# Patient Record
Sex: Female | Born: 1990
Health system: Southern US, Community
[De-identification: ages and names within clinical notes are randomized; demographics above are authoritative.]

## PROBLEM LIST (undated history)

## (undated) DIAGNOSIS — B029 Zoster without complications: Secondary | ICD-10-CM

## (undated) DIAGNOSIS — F32A Depression, unspecified: Secondary | ICD-10-CM

## (undated) DIAGNOSIS — F4310 Post-traumatic stress disorder, unspecified: Secondary | ICD-10-CM

## (undated) DIAGNOSIS — F329 Major depressive disorder, single episode, unspecified: Secondary | ICD-10-CM

## (undated) DIAGNOSIS — M792 Neuralgia and neuritis, unspecified: Secondary | ICD-10-CM

## (undated) HISTORY — DX: Post-traumatic stress disorder, unspecified: F43.10

## (undated) HISTORY — DX: Zoster without complications: B02.9

## (undated) HISTORY — DX: Depression, unspecified: F32.A

## (undated) HISTORY — DX: Neuralgia and neuritis, unspecified: M79.2

## (undated) HISTORY — PX: INCISE AND DRAIN ABCESS: PRO64

## (undated) HISTORY — DX: Major depressive disorder, single episode, unspecified: F32.9

## (undated) NOTE — Progress Notes (Signed)
 Formatting of this note is different from the original. Images from the original note were not included. [facility] URGENT CARE  Subjective   Subjective:   Patient ID: [name] is an 29 y.o. female.  Chief Complaint: Chief Complaint  Patient presents with   Wound Infection    Right hand states had bug bite and its got infection    Animal Bite Contact animal:  Insect Location:  Hand Hand injury location:  Dorsum of R hand Time since incident:  2 days Pain details:    Quality:  Dull   Severity:  Mild   Progression:  Unchanged Relieved by:  Nothing Worsened by:  Nothing Ineffective treatments:  None tried Associated symptoms: no fever    No past medical history on file. No past surgical history on file. No family history on file. Social History   Tobacco Use   Smoking status: Never   Smokeless tobacco: Never   No Known Allergies Current Outpatient Medications on File Prior to Visit  Medication Sig Dispense Refill   LATUDA 40 mg Tab TK 1 T PO QPM WC  2   predniSONE  (DELTASONE ) 20 mg tablet Take 2 Tabs by mouth Daily. 10 Tab 0   triamcinolone (KENALOG) 0.1 % cream Apply to rash 2 times per day 454 g 0   Review of Systems  Constitutional:  Negative for activity change, chills, fatigue and fever.  HENT:  Negative for congestion, ear pain, postnasal drip and rhinorrhea.   Respiratory:  Negative for cough and shortness of breath.   Cardiovascular:  Negative for chest pain.  Gastrointestinal:  Negative for abdominal pain, diarrhea, nausea and vomiting.  Genitourinary:  Negative for dysuria.  Musculoskeletal:  Negative for myalgias.  Skin:  Negative for color change and pallor.  Neurological:  Negative for dizziness, light-headedness and headaches.     Objective  Objective:   BP 136/79   Pulse 84   Temp 97.4 F (36.3 C)   Wt 89.4 kg (197 lb)   LMP 05/30/2024   SpO2 99%   Physical Exam Vitals and nursing note reviewed.  Constitutional:      General: She  is not in acute distress.    Appearance: Normal appearance. She is not ill-appearing.  HENT:     Head: Normocephalic and atraumatic.     Right Ear: External ear normal.     Left Ear: External ear normal.     Nose: Nose normal.   Cardiovascular:     Rate and Rhythm: Normal rate.     Pulses: Normal pulses.  Pulmonary:     Effort: Pulmonary effort is normal. No respiratory distress.   Musculoskeletal:        General: Normal range of motion.       Arms:  Skin:    General: Skin is warm and dry.   Neurological:     General: No focal deficit present.     Mental Status: She is alert and oriented to person, place, and time.     GCS: GCS eye subscore is 4. GCS verbal subscore is 5. GCS motor subscore is 6.   Psychiatric:        Mood and Affect: Mood normal.        Behavior: Behavior normal.   Procedures   Assessment:   1. Insect bite of right hand, initial encounter  bacitracin 500 unit/gram Oint      Plan:   Orders Placed This Encounter   bacitracin 500 unit/gram Oint   Patient Instructions  General Discharge Instructions  You were evaluated in the Urgent Care today. Your condition is stable and safe for discharge. At this time, no serious illness or emergency was found. Some problems may change or become clearer with time.  Please monitor your symptoms closely. Return to Urgent Care or go to the ER if you notice: worsening pain, high fever, chest pain, shortness of breath, confusion, or any other concerning change.  Medications  If prescriptions were provided, take exactly as directed.  Do not stop early unless instructed by a provider.  Do not share medication with others.  Contact us  or your pharmacist with questions or if you notice side effects.  Self-care  Use over-the-counter medications such as ibuprofen  or acetaminophen  as needed for pain or fever (unless told otherwise). Rest, stay hydrated, and eat as tolerated.  Follow-up  Follow up with your  Primary Care Provider within the next few days for ongoing care and reevaluation as needed. If new or concerning symptoms develop, seek medical attention right away.    Electronically signed by Auston Chew, APRN at 06/07/2024  8:58 PM EDT

---
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
## 2010-02-06 ENCOUNTER — Encounter (INDEPENDENT_AMBULATORY_CARE_PROVIDER_SITE_OTHER): Payer: Self-pay | Admitting: Internal Medicine

## 2010-02-25 ENCOUNTER — Ambulatory Visit: Payer: Self-pay | Admitting: Internal Medicine

## 2010-02-25 DIAGNOSIS — J45909 Unspecified asthma, uncomplicated: Secondary | ICD-10-CM | POA: Insufficient documentation

## 2010-02-25 DIAGNOSIS — B3731 Acute candidiasis of vulva and vagina: Secondary | ICD-10-CM | POA: Insufficient documentation

## 2010-02-25 DIAGNOSIS — F988 Other specified behavioral and emotional disorders with onset usually occurring in childhood and adolescence: Secondary | ICD-10-CM | POA: Insufficient documentation

## 2010-02-25 DIAGNOSIS — F4310 Post-traumatic stress disorder, unspecified: Secondary | ICD-10-CM

## 2010-02-25 DIAGNOSIS — F319 Bipolar disorder, unspecified: Secondary | ICD-10-CM | POA: Insufficient documentation

## 2010-02-25 DIAGNOSIS — B373 Candidiasis of vulva and vagina: Secondary | ICD-10-CM

## 2010-02-25 DIAGNOSIS — J309 Allergic rhinitis, unspecified: Secondary | ICD-10-CM | POA: Insufficient documentation

## 2010-02-25 DIAGNOSIS — B081 Molluscum contagiosum: Secondary | ICD-10-CM

## 2010-02-25 LAB — CONVERTED CEMR LAB
Chlamydia, DNA Probe: NEGATIVE
Whiff Test: NEGATIVE

## 2010-02-28 ENCOUNTER — Encounter (INDEPENDENT_AMBULATORY_CARE_PROVIDER_SITE_OTHER): Payer: Self-pay | Admitting: Internal Medicine

## 2010-03-11 ENCOUNTER — Ambulatory Visit: Payer: Self-pay | Admitting: Internal Medicine

## 2010-04-02 ENCOUNTER — Ambulatory Visit: Payer: Self-pay | Admitting: Internal Medicine

## 2010-05-15 ENCOUNTER — Emergency Department (HOSPITAL_COMMUNITY): Admission: EM | Admit: 2010-05-15 | Discharge: 2010-05-15 | Payer: Self-pay | Admitting: Family Medicine

## 2010-05-28 ENCOUNTER — Ambulatory Visit: Payer: Self-pay | Admitting: Internal Medicine

## 2010-06-06 ENCOUNTER — Telehealth (INDEPENDENT_AMBULATORY_CARE_PROVIDER_SITE_OTHER): Payer: Self-pay | Admitting: Internal Medicine

## 2010-06-06 ENCOUNTER — Encounter (INDEPENDENT_AMBULATORY_CARE_PROVIDER_SITE_OTHER): Payer: Self-pay | Admitting: Internal Medicine

## 2010-07-01 ENCOUNTER — Telehealth (INDEPENDENT_AMBULATORY_CARE_PROVIDER_SITE_OTHER): Payer: Self-pay | Admitting: Internal Medicine

## 2010-07-03 ENCOUNTER — Ambulatory Visit: Payer: Self-pay | Admitting: Internal Medicine

## 2010-07-17 ENCOUNTER — Ambulatory Visit: Payer: Self-pay | Admitting: Obstetrics & Gynecology

## 2010-07-24 ENCOUNTER — Ambulatory Visit: Payer: Self-pay | Admitting: Obstetrics and Gynecology

## 2010-07-25 ENCOUNTER — Encounter: Payer: Self-pay | Admitting: Obstetrics and Gynecology

## 2010-07-25 ENCOUNTER — Ambulatory Visit: Payer: Self-pay | Admitting: Obstetrics & Gynecology

## 2010-07-25 LAB — CONVERTED CEMR LAB
BUN: 11 mg/dL (ref 6–23)
CO2: 23 meq/L (ref 19–32)
Calcium: 9.4 mg/dL (ref 8.4–10.5)
Chloride: 103 meq/L (ref 96–112)
Cholesterol: 129 mg/dL (ref 0–200)
Creatinine, Ser: 0.9 mg/dL (ref 0.40–1.20)
Glucose, Bld: 86 mg/dL (ref 70–99)
HCT: 39.7 % (ref 36.0–46.0)
HDL: 53 mg/dL (ref >39)
Hemoglobin: 13.4 g/dL (ref 12.0–15.0)
LDL Cholesterol: 63 mg/dL (ref 0–99)
MCHC: 33.8 g/dL (ref 30.0–36.0)
MCV: 88.2 fL (ref 78.0–100.0)
Platelets: 293 10*3/uL (ref 150–400)
Potassium: 4.4 meq/L (ref 3.5–5.3)
Prolactin: 17.4 ng/mL
RBC: 4.5 M/uL (ref 3.87–5.11)
RDW: 12.2 % (ref 11.5–15.5)
Sodium: 137 meq/L (ref 135–145)
TSH: 3.399 microintl units/mL (ref 0.350–4.500)
Total CHOL/HDL Ratio: 2.4
Triglycerides: 67 mg/dL (ref <150)
VLDL: 13 mg/dL (ref 0–40)
WBC: 5.7 10*3/uL (ref 4.0–10.5)

## 2010-08-06 ENCOUNTER — Ambulatory Visit: Payer: Self-pay | Admitting: Obstetrics and Gynecology

## 2010-08-12 ENCOUNTER — Emergency Department (HOSPITAL_COMMUNITY): Admission: EM | Admit: 2010-08-12 | Discharge: 2010-08-12 | Payer: Self-pay | Admitting: Emergency Medicine

## 2010-08-26 ENCOUNTER — Emergency Department (HOSPITAL_COMMUNITY)
Admission: EM | Admit: 2010-08-26 | Discharge: 2010-08-26 | Payer: Self-pay | Source: Home / Self Care | Admitting: Family Medicine

## 2010-09-11 ENCOUNTER — Ambulatory Visit: Payer: Self-pay | Admitting: Obstetrics and Gynecology

## 2010-09-25 ENCOUNTER — Ambulatory Visit: Payer: Self-pay | Admitting: Obstetrics & Gynecology

## 2010-09-29 ENCOUNTER — Encounter (INDEPENDENT_AMBULATORY_CARE_PROVIDER_SITE_OTHER): Payer: Self-pay | Admitting: Internal Medicine

## 2010-10-05 ENCOUNTER — Emergency Department (HOSPITAL_COMMUNITY)
Admission: EM | Admit: 2010-10-05 | Discharge: 2010-10-05 | Payer: Self-pay | Source: Home / Self Care | Admitting: Family Medicine

## 2010-10-23 ENCOUNTER — Telehealth (INDEPENDENT_AMBULATORY_CARE_PROVIDER_SITE_OTHER): Payer: Self-pay | Admitting: Internal Medicine

## 2010-10-23 ENCOUNTER — Ambulatory Visit
Admission: RE | Admit: 2010-10-23 | Discharge: 2010-10-23 | Payer: Self-pay | Source: Home / Self Care | Attending: Internal Medicine | Admitting: Internal Medicine

## 2010-10-23 ENCOUNTER — Encounter (INDEPENDENT_AMBULATORY_CARE_PROVIDER_SITE_OTHER): Payer: Self-pay | Admitting: Internal Medicine

## 2010-10-23 DIAGNOSIS — R209 Unspecified disturbances of skin sensation: Secondary | ICD-10-CM | POA: Insufficient documentation

## 2010-10-23 LAB — CONVERTED CEMR LAB
ALT: 9 units/L (ref 0–35)
AST: 16 units/L (ref 0–37)
Alkaline Phosphatase: 64 units/L (ref 39–117)
BUN: 13 mg/dL (ref 6–23)
CO2: 25 meq/L (ref 19–32)
Calcium: 9.6 mg/dL (ref 8.4–10.5)
Eosinophils Absolute: 0.3 10*3/uL (ref 0.0–0.7)
Eosinophils Relative: 5 % (ref 0–5)
Glucose, Bld: 78 mg/dL (ref 70–99)
Lymphocytes Relative: 34 % (ref 12–46)
Lymphs Abs: 2.2 10*3/uL (ref 0.7–4.0)
MCV: 87.6 fL (ref 78.0–100.0)
Monocytes Absolute: 0.5 10*3/uL (ref 0.1–1.0)
Monocytes Relative: 7 % (ref 3–12)
RDW: 12.3 % (ref 11.5–15.5)
Sed Rate: 4 mm/hr (ref 0–22)
Sodium: 140 meq/L (ref 135–145)
TSH: 1.948 microintl units/mL (ref 0.350–4.500)
Total Bilirubin: 0.7 mg/dL (ref 0.3–1.2)
Vitamin B-12: 584 pg/mL (ref 211–911)

## 2010-10-24 ENCOUNTER — Encounter (INDEPENDENT_AMBULATORY_CARE_PROVIDER_SITE_OTHER): Payer: Self-pay | Admitting: Internal Medicine

## 2010-10-28 ENCOUNTER — Telehealth (INDEPENDENT_AMBULATORY_CARE_PROVIDER_SITE_OTHER): Payer: Self-pay | Admitting: Internal Medicine

## 2010-10-29 NOTE — Assessment & Plan Note (Signed)
Summary: 2 WEEK FU///KT   Vital Signs:  Patient profile:   20 year old female Weight:      156 pounds Temp:     98.1 degrees F Pulse rate:   64 / minute Pulse rhythm:   regular Resp:     18 per minute BP sitting:   117 / 65  (left arm) Cuff size:   regular  Vitals Entered By: Vesta Mixer CMA (March 11, 2010 2:58 PM) CC: 2 week f/u Is Patient Diabetic? No Pain Assessment Patient in pain? yes     Location: ears Intensity: 6  Does patient need assistance? Ambulation Normal   CC:  2 week f/u.  History of Present Illness: 1.  Genital warts:  not too much discomfort with last treatment.  Not sure if still with lesions.  Discussed GC/Chlamydia was negative.  Pt. avoiding sexual activity recently.  Allergies (verified): 1)  ! * Bananas 2)  * Cats  Physical Exam  Skin:  Some inflammatory changes of large warts, but multiple 1-2 mm warts scattered on inner thighs, perineum and near left intoitus--all treated with Podophyllin.  Pt. tolerated well.   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  Orders: Wart Destruct <14 ([address])  Complete Medication List: 1)  Vyvanse 70 Mg Caps (Lisdexamfetamine dimesylate) .Marland Kitchen.. 1 cap by mouth daily with meal.  [facility] 2)  Lamictal 25 Mg Tabs (Lamotrigine) .Marland Kitchen.. 1 tab by mouth at bedtime--[facility] 3)  Risperdal 2 Mg Tabs (Risperidone) .Marland Kitchen.. 1 tab by mouth at bedtime 4)  Fluconazole 150 Mg Tabs (Fluconazole) .Marland Kitchen.. 1 tab by mouth for one dose  Patient Instructions: 1)  Follow up with Dr. Delrae Alfred in 2 weeks-for retreatment

## 2010-10-29 NOTE — Letter (Signed)
Summary: RECENT RECORDS FROM [facility]  RECENT RECORDS FROM [facility]   Imported By: Arta Bruce 02/28/2010 10:01:53  _____________________________________________________________________  External Attachment:    Type:   Image     Comment:   External Document

## 2010-10-29 NOTE — Assessment & Plan Note (Signed)
Summary: ESTABLISH CARE/DERM REFERRAL///RJP   Vital Signs:  Patient profile:   20 year old female Height:      69.5 inches Weight:      160 pounds BMI:     23.37 Temp:     97.8 degrees F Pulse rate:   87 / minute Pulse rhythm:   regular Resp:     18 per minute BP sitting:   127 / 81  (left arm) Cuff size:   regular  Vitals Entered By: Vesta Mixer CMA (Feb 25, 2010 12:28 PM) CC: Transferring from [facility],  needs to see derm for warts on her buttock.   Pt taking vyvance, lamictil and risperdol not sure of doses. Is Patient Diabetic? No Pain Assessment Patient in pain? yes     Location: ears Intensity: 4  Does patient need assistance? Ambulation Normal   CC:  Transferring from [facility], needs to see derm for warts on her buttock.   Pt taking vyvance, and lamictil and risperdol not sure of doses..  History of Present Illness: 20 yo female here to establish.  Aged out of [facility].  1.  Warts that started on butt and have spread to inside her legs/groin area.  Not sure how she got them.  Was told per pt. to go to dermatology to have them removed. Never heard anything regarding that referral.  Pt. thinks she received her full regimen of Gardasil.    2.  Has had ear pain:  scratched inside of ear canal, but was told last time was into pediatrician that it was almost healed.  Both ears continue to hurt.  Sometimes a sharp poking, other times a pressure pain.   Ears pop a lot.  Symptoms started maybe 2 months ago.  May have had some throat symptoms with this.  Pt. a bit of a meandering historian.  No nasal symptoms or itchy watery eyes.  3.  Asthma:  Uses rescue inhaler twice monthly  4.  Allergic Rhinitis:  both seasonal and perennial.  Allergic to grasses and cats.       Allergies (verified): 1)  ! * Bananas 2)  * Cats  Past History:  Past Medical History: ALLERGIC RHINITIS (ICD-477.9) ASTHMA (ICD-493.90) POST TRAUMATIC STRESS SYNDROME (ICD-309.81) BIPOLAR  DISORDER UNSPECIFIED (ICD-296.80) ATTENTION DEFICIT DISORDER (ICD-314.00)  Past Surgical History: None  Family History: Mother, 50:  Hepatitis C Father, 37:  Incarcerated, healthy Sister, 20:  IDDM. 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   Not using protection.   Tobacco:  Started age 51.  1/2 ppd. Drugs:  Hx MJ, Has tried IV drugs in past--cocaine.  Tried earlier this year.  Has not been checked for HIV since.  HIV tests prior negative. Alcohol:  Does not use much.  Physical Exam  General:  Multiple piercings Genitalia:  Multiple warts on upper inner thigh of left leg and involving posterior vaginal opening.  Also with patches of erythema with craquelled dry flaking overlying and some clear fluid weeping from cracked areas, located lateral to posterior labia major bilaterally.  No ulcers noted in these lesions.  White discharge with erythema of vaginal mucosa.  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  Pt. tolerated well  Orders: T- GC Chlamydia (78469)  Problem # 2:  VAGINITIS, CANDIDAL (ICD-112.1)  Not clear this is defintively the diagnosis- but exteranl vulvar area appears this way. Her updated medication list for this problem includes:    Fluconazole 150 Mg Tabs (Fluconazole) .Marland Kitchen... 1 tab by mouth for one dose  Orders: T- GC Chlamydia ([address]52)  Complete Medication List: 1)  Vyvanse 70 Mg Caps (Lisdexamfetamine dimesylate) .Marland Kitchen.. 1 cap by mouth daily with meal.  [facility] 2)  Lamictal 25 Mg Tabs (Lamotrigine) .Marland Kitchen.. 1 tab by mouth at bedtime--[facility] 3)  Risperdal 2 Mg Tabs (Risperidone) .Marland Kitchen.. 1 tab by mouth at  bedtime 4)  Fluconazole 150 Mg Tabs (Fluconazole) .Marland Kitchen.. 1 tab by mouth for one dose  Patient Instructions: 1)  Follow up with Dr. Delrae Alfred in 2 weeks--follow up warts Prescriptions: FLUCONAZOLE 150 MG TABS (FLUCONAZOLE) 1 tab by mouth for one dose  #1 x 0   Entered and Authorized by:   Julieanne Manson MD   Signed by:   Julieanne Manson MD on 02/25/2010   Method used:   Electronically to        [facility] [address] [phone]* (retail)       [address]       [place], [place]  [address]       Ph: [phone] or [phone]       Fax: [phone]   RxID:   [id number]   Laboratory Results    Wet Mount/KOH Source: vaginal WBC/hpf: 1-5 Bacteria/hpf: 2+ Clue cells/hpf: none  Negative whiff Yeast/hpf: ? possibly some budding yeast Trichomonas/hpf: none   Appended Document: ESTABLISH CARE/DERM REFERRAL///RJP Tiffany--can you pull up her immunization record

## 2010-10-29 NOTE — Assessment & Plan Note (Signed)
Summary: FU ON SKIN/////KT   Vital Signs:  Patient profile:   20 year old female Height:      69.5 inches Weight:      168.7 pounds Temp:     97.4 degrees F Pulse rate:   82 / minute Pulse rhythm:   regular Resp:     16 per minute BP sitting:   106 / 72  (left arm) Cuff size:   regular  Vitals Entered By: [name] (May 28, 2010 10:23 AM) CC: PT PRESENTS TO CLINIC FOR FOLLOW UP ON DERM CONCERNS OF R FOOT/APPT HOWEVER PT WAS UNABLE TO SEE DERMATOLOGIST B/C REFERRAL WAS INCORRECT Is Patient Diabetic? No Pain Assessment Patient in pain? no       Does patient need assistance? Functional Status Self care Ambulation Normal   CC:  PT PRESENTS TO CLINIC FOR FOLLOW UP ON DERM CONCERNS OF R FOOT/APPT HOWEVER PT WAS UNABLE TO SEE DERMATOLOGIST B/C REFERRAL WAS INCORRECT.  History of Present Illness: 1.  Vulvar/perineal lesions--has had several treatments and not responding.  Apparent problem with referral as [facility] listed as primary care on Medicaid card, though we are all one entity now.  Derm refused to see because of this.  Thinks she still has lesions, but not sure.  They have not responded to podophyllin.  2.  Was seen in Urgent care for sprained right toe--no problem now.  Allergies: 1)  ! * Bananas 2)  * Cats  Physical Exam  General:  Tearful at times--wants to get rid of vulvar/perineal lesions.   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 Have not responded well to podophyllin. Currently unable to get into derm--send to gyne clinic. Orders: Gynecologic Referral (Gyn)  Complete Medication List: 1)  Vyvanse 70 Mg Caps (Lisdexamfetamine dimesylate) .Marland Kitchen.. 1 cap by mouth daily with meal.  [facility] 2)  Lamictal 25 Mg Tabs (Lamotrigine) .Marland Kitchen.. 1 tab by mouth at bedtime--[facility] 3)  Risperdal 2 Mg Tabs (Risperidone) .Marland Kitchen.. 1 tab by mouth at bedtime 4)  Fluconazole 150 Mg Tabs (Fluconazole) .Marland Kitchen.. 1 tab  by mouth for one dose  Patient Instructions: 1)  Go today to DSS and get Medicaid card changed to [facility] ([facility]).  Your [name] is Dr. Julieanne Manson 2)  Call if any problems getting this fixed

## 2010-10-29 NOTE — Progress Notes (Signed)
Summary: Gyn referral  Phone Note Outgoing Call   Summary of Call: Nora--gyn referral--[facility].  Referral letter as well. Initial call taken by: Julieanne Manson MD,  June 06, 2010 9:31 AM  Follow-up for Phone Call        SEND REFERRAL TO [facility] WAITING FOR AN APPT  Follow-up by: Cheryll Dessert,  June 10, 2010 4:12 PM

## 2010-10-29 NOTE — Assessment & Plan Note (Signed)
Summary: fu with Dr Delrae Alfred in 2 weeks for retreatment//gk   Vital Signs:  Patient profile:   20 year old female Weight:      161 pounds Temp:     97.8 degrees F Pulse rate:   78 / minute Pulse rhythm:   regular Resp:     18 per minute BP sitting:   101 / 60  (left arm) Cuff size:   regular  Vitals Entered By: Vesta Mixer CMA (April 02, 2010 3:10 PM) CC: f/u treatment for warts. Is Patient Diabetic? No Pain Assessment Patient in pain? no       Does patient need assistance? Ambulation Normal   CC:  f/u treatment for warts..  History of Present Illness: 1.  Genital warts:  not sure if warts completely gone.  Something white came out of one of the wars and now the wart looks "angry"  Allergies (verified): 1)  ! * Bananas 2)  * Cats  Physical Exam  Skin:  Most of previous lesions still present, but flatter.  1/2 cm lesion now in left posterior labia minora near vaginal opening--Not clear if this is a wart.  Flatter round lesiond on inner left thigh also likely a large skin tag.  All treated with Podophyllin.   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  Orders: Cryotherapy/Destruction benign or premalignant lesion (2nd-14th lesions) ([address])  Complete Medication List: 1)  Vyvanse 70 Mg Caps (Lisdexamfetamine dimesylate) .Marland Kitchen.. 1 cap by mouth daily with meal.  [facility] 2)  Lamictal 25 Mg Tabs (Lamotrigine) .Marland Kitchen.. 1 tab by mouth at bedtime--[facility] 3)  Risperdal 2 Mg Tabs (Risperidone) .Marland Kitchen.. 1 tab by mouth at bedtime 4)  Fluconazole 150 Mg Tabs (Fluconazole) .Marland Kitchen.. 1 tab by mouth for one dose  Patient Instructions: 1)  Call for follow up if Dermatology recommends continued Podophyllin treatment of some of your lesions

## 2010-10-29 NOTE — Letter (Signed)
Summary: *Referral Letter  [facility]  [address] [place], [place] [address]   Phone: [phone]  Fax: [phone]    06/06/2010  Thank you in advance for agreeing to see my patient:  [name] [address] [place], [place]  [address]  Phone: [phone]  Reason for Referral: Vulvar and perineal area lesions.  Have treated lesions with podophyllin topically with minimal improvement.  Not clear what they are--would like your opinion and treatment.  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  [facility] 2)  LAMICTAL 25 MG TABS (LAMOTRIGINE) 1 tab by mouth at bedtime--[facility] 3)  RISPERDAL 2 MG TABS (RISPERIDONE) 1 tab by mouth at bedtime 4)  FLUCONAZOLE 150 MG TABS (FLUCONAZOLE) 1 tab by mouth for one dose   Past Medical History: 1)  ALLERGIC RHINITIS (ICD-477.9) 2)  ASTHMA (ICD-493.90) 3)  POST TRAUMATIC STRESS SYNDROME (ICD-309.81) 4)  BIPOLAR DISORDER UNSPECIFIED (ICD-296.80) 5)  ATTENTION DEFICIT DISORDER (ICD-314.00)   Prior History of Blood Transfusions:   Pertinent Labs:    Thank you again for agreeing to see our patient; please contact us if you have any further questions or need additional information.  Sincerely,  Julieanne Manson MD

## 2010-10-29 NOTE — Progress Notes (Signed)
Summary: FYI ABOUT MEDICAID CARD  Phone Note Call from Patient Call back at Home Phone [phone]   Summary of Call: [facility]UL[facility]. [facility] [place] CALLED TO LET YOU NOW THAT HER MEDICAID CARD WILL NOT BE CHANGED TILL NEXT MONTH TO HAVE OUR ADDRESS INSTEAD OF SAYING [facility] ADDRESS.Francis Dowse SAYS SHE HOPE SHE DOESN'T HAVE ANY PROBLEMS WHEN SHE GOES TO [facility]YN APPT [facility] Initial call taken by: Leodis Rains,  July 01, 2010 10:02 AM  Follow-up for Phone Call        I soke with GYN clinics and as long as pt can pay $3 co pay she should be fine. advised to remind pt to ask for Antoniette if she runs into any problems  Follow-up by: Michelle Nasuti,  July 02, 2010 9:51 AM

## 2010-10-29 NOTE — Letter (Signed)
Summary: PT INFORMATION SHEET  PT INFORMATION SHEET   Imported By: Arta Bruce 04/07/2010 12:21:12  _____________________________________________________________________  External Attachment:    Type:   Image     Comment:   External Document

## 2010-10-30 NOTE — Letter (Signed)
Summary: *HSN Results Follow up  [facility]  [address] [place], [place] [address]   Phone: [phone]  Fax: [phone]      10/24/2010   [name] Golaszewski [address] [place], [place]  [address]   Dear  [name],                            ____S.Drinkard,FNP   ____D. Gore,FNP       ____B. McPherson,MD   ____V. Rankins,MD    __X__[name],MD    ____N. Daphine Deutscher, FNP  ____D. Reche Dixon, MD    ____K. Philipp Deputy, MD    ____Other     This letter is to inform you that your recent test(s):  _______Pap Smear    ____X___Lab Test     _______X-ray    ___X____ is within acceptable limits  _______ requires a medication change  _______ requires a follow-up lab visit  _______ requires a follow-up visit with your provider   Comments:  Labs were fine.       _________________________________________________________ If you have any questions, please contact our office                     Sincerely,  Julieanne Manson MD [facility]

## 2010-10-30 NOTE — Progress Notes (Signed)
Summary: Neuro consult  Phone Note Outgoing Call   Summary of Call: Nora--send my last note and order for Neuro consult--dysesthesias--labs pending. Initial call taken by: Julieanne Manson MD,  October 23, 2010 2:50 PM  Follow-up for Phone Call        Dr Delrae Alfred   can you please, send me a Referral form to send the pt to Neuro  Follow-up by: Cheryll Dessert,  October 23, 2010 4:21 PM  Additional Follow-up for Phone Call Additional follow up Details #1::        Please fax recent labs with referral. Additional Follow-up by: Julieanne Manson MD,  October 24, 2010 8:32 AM    Additional Follow-up for Phone Call Additional follow up Details #2::    I SEND THE REFERRAL WITH OV AND LABS TO [facility]  WAITING FOR AN APPT.Marland KitchenCheryll Dessert  October 24, 2010 10:10 AM

## 2010-10-30 NOTE — Letter (Signed)
Summary: INTERNAL TRANSFER//RECEIVED RECORDS/HISTORICAL CHART  INTERNAL TRANSFER//RECEIVED RECORDS/HISTORICAL CHART   Imported By: Arta Bruce 10/07/2010 14:08:41  _____________________________________________________________________  External Attachment:    Type:   Image     Comment:   External Document

## 2010-10-30 NOTE — Miscellaneous (Signed)
  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 Okey Dupre

## 2010-10-30 NOTE — Assessment & Plan Note (Signed)
Summary: SHINGLES//CNS   Vital Signs:  Patient profile:   20 year old female LMP:     07/2010 Weight:      171.5 pounds BMI:     25.05 Temp:     98.2 degrees F oral Pulse rate:   72 / minute Pulse rhythm:   regular Resp:     16 per minute BP sitting:   136 / 90  (left arm) Cuff size:   regular  Vitals Entered By: Levon Hedger (October 23, 2010 2:05 PM) CC: nerve pain x 1 month...went to urgent care thinks she has shingles and is having alot of pain Is Patient Diabetic? No Pain Assessment Patient in pain? yes     Location: sides, back Intensity: 5  Does patient need assistance? Functional Status Self care Ambulation Normal LMP (date): 07/2010     Enter LMP: 07/2010   CC:  nerve pain x 1 month...went to urgent care thinks she has shingles and is having alot of pain.  History of Present Illness: Seen in ED 10/05/10 for burning pain right lower thorax.  Pt. actually states pain was bilateral and she has not had a visible rash.   Was given Valrex 1 g by mouth three times a day for 7 day course in case of possible shingles, but that did not help.    Sounds like started on right lateral thorax, then left lateral thorax, now lower back and right axillary area.   Continues to have burning pain, sometimes shooting, stabbin pain.  Pt. states the burning can be very intense--more so before went to ED, now just little areas pop up with discomfort.  Always has some baseline discomfort.  Pain can suddenly get worse.  Does not seem associated with anything in particular.  The pain is in her skin, not deep to muscles.  Pt. quit smoking several months ago.  She does smoke MJ, but states that is something she does more so now because of the pain.  She cannot recall smoking it right before her pain started.  She feels her source is safe and would not be putting in an additive.  She does not drink alchohol  Allergies (verified): 1)  ! 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   Tobacco:  Started age 28.  1/2 ppd.--stopped 2011 Drugs:  Hx MJ, Has tried IV drugs in past--cocaine.  Tried earlier this year.  Has not been checked for HIV since.  HIV tests prior negative. Alcohol:  Does not use much.  Physical Exam  General:  NAD Skin:  No rashes. Hyperesthesia over right lower thoracic and lumbar area posteriorly. Normal pinprick, light touch   Impression & Recommendations:  Problem # 1:  DISTURBANCE OF SKIN SENSATION (ICD-782.0)  Hold on starting Neurontin just yet. Unlikely to be Zoster with multiple nerve involvement.  Orders: T-Comprehensive Metabolic Panel [id number] [phone]) T-CBC w/Diff [phone]) T-Vitamin B12 [phone]) T-Sed Rate (Automated) ([id number]) T-TSH [phone])  Complete Medication List: 1)  Vyvanse 70 Mg Caps (Lisdexamfetamine dimesylate) .Marland Kitchen.. 1 cap by mouth daily with meal.  [facility] 2)  Lamictal 25 Mg Tabs (Lamotrigine) .Marland Kitchen.. 1 tab by mouth at bedtime--[facility] 3)  Risperdal 2 Mg Tabs (Risperidone) .Marland Kitchen.. 1 tab by mouth at bedtime 4)  Fluconazole 150 Mg Tabs (Fluconazole) .Marland Kitchen.. 1 tab by mouth for one dose  Patient Instructions: 1)  Call if just cannot tolerate pain. 2)  Try capsaicin cream three times a day to areas of pain.   Orders Added: 1)  T-Comprehensive Metabolic Panel [[id number]] 2)  T-CBC w/Diff [[id number]] 3)  T-Vitamin B12 [[id number]] 4)  T-Sed Rate (Automated) [[id number]] 5)  T-TSH [[id number]] 6)  Est. Patient Level III [57846]

## 2010-11-05 NOTE — Progress Notes (Signed)
  Phone Note Call from Patient   Summary of Call: pt called to say she is unable to tolerate the pain she is going through.... pt says her pain is her back and sides.... [facility] [address] Initial call taken by: Armenia Shannon,  October 28, 2010 3:57 PM  Follow-up for Phone Call        Has she tried the Capsaicin cream? Follow-up by: Julieanne Manson MD,  October 29, 2010 5:52 AM  Additional Follow-up for Phone Call Additional follow up Details #1::        Left message on answering machine for pt to call back...Marland KitchenMarland KitchenArmenia Shannon  October 29, 2010 4:49 PM  pt says yes she brought it and it burns when she put it on....  Armenia Shannon  October 30, 2010 11:30 AM  Right--I discussed that with her--but after a few minutes, it should numb the pain--ask her to try it for a couple of days at least  Julieanne Manson MD  October 30, 2010 1:42 PM      Additional Follow-up for Phone Call Additional follow up Details #2::    pt is aware Follow-up by: Armenia Shannon,  October 30, 2010 4:18 PM

## 2010-11-07 ENCOUNTER — Telehealth (INDEPENDENT_AMBULATORY_CARE_PROVIDER_SITE_OTHER): Payer: Self-pay | Admitting: Internal Medicine

## 2010-11-18 ENCOUNTER — Encounter: Payer: Self-pay | Admitting: Internal Medicine

## 2010-11-18 ENCOUNTER — Encounter (INDEPENDENT_AMBULATORY_CARE_PROVIDER_SITE_OTHER): Payer: Self-pay | Admitting: Internal Medicine

## 2010-11-18 LAB — CONVERTED CEMR LAB

## 2010-11-19 NOTE — Progress Notes (Signed)
Summary: CAPSAICIN CREAM NOT HELPING  Phone Note Call from Patient Call back at Home Phone [phone]   Reason for Call: Talk to Nurse Summary of Call: Lesleyann Fichter PT. MS Haji CALLED AND SAYS THAT THE CAPSAICIN CREAM SHE IS USING IS JUST NOT HELPING, SHE IS STILL IN A LOT OF PAIN, AND WOULD LIKE TO KNOW WHAT WOULD BE THE NEXT THING TO DO. Initial call taken by: Leodis Rains,  November 07, 2010 3:13 PM  Follow-up for Phone Call        Has been using capsaicin since she last saw Dr. Delrae Alfred -- has used it about 3 times daily, did help some, but feels like it's not really helping, winds up putting it on everywhere because pain is everywhere.  It's expensive and doesn't seem to be as effective as she needs it to be.  Follow-up by: Dutch Quint RN,  November 07, 2010 4:14 PM  Additional Follow-up for Phone Call Additional follow up Details #1::        Please see if we can get her in to see me again next week to reevaluate. I would also like to run some more tests This info for me only (RPR, HIV, Lyme serology, consider MRI of spine) Additional Follow-up by: Julieanne Manson MD,  November 08, 2010 1:48 PM    Additional Follow-up for Phone Call Additional follow up Details #2::    Left message on answering machine for pt. to return call.  Dutch Quint RN  November 10, 2010 5:12 PM  Appt. scheduled 11/18/10.  Dutch Quint RN  November 11, 2010 4:47 PM

## 2010-11-21 ENCOUNTER — Encounter (INDEPENDENT_AMBULATORY_CARE_PROVIDER_SITE_OTHER): Payer: Self-pay | Admitting: Internal Medicine

## 2010-11-25 NOTE — Letter (Signed)
Summary: *HSN Results Follow up  [facility]  [address] [place], [place] [address]   Phone: [phone]  Fax: [phone]      11/21/2010   MARSHIA TROPEA Wollam [address] [place], [place]  [address]   Dear  Ms. Ferol Baillargeon,                            ____S.Drinkard,FNP   ____D. Gore,FNP       ____B. McPherson,MD   ____V. Rankins,MD    _X___E. Lycan Davee,MD    ____N. Daphine Deutscher, FNP  ____D. Reche Dixon, MD    ____K. Philipp Deputy, MD    ____Other     This letter is to inform you that your recent test(s):  _______Pap Smear    ___X____Lab Test     _______X-ray    ___X____ is within acceptable limits  _______ requires a medication change  _______ requires a follow-up lab visit  _______ requires a follow-up visit with your provider   Comments:  Testing for syphilis, Lyme's disease were negative.  (they were okay)       _________________________________________________________ If you have any questions, please contact our office                     Sincerely,  Julieanne Manson MD [facility]

## 2010-11-25 NOTE — Assessment & Plan Note (Signed)
Summary: f/u generalized pain   Vital Signs:  Patient profile:   20 year old female Menstrual status:  regular LMP:     11/11/2010 Weight:      180.44 pounds Temp:     97.2 degrees F oral Pulse rate:   76 / minute Pulse rhythm:   regular Resp:     16 per minute BP sitting:   116 / 70  (left arm) Cuff size:   regular  Vitals Entered By: Hale Drone CMA (November 18, 2010 12:17 PM) CC: Having pain on back, sides of body and abd. area. Getting worse. Experiencing joint pains when it rains or when its cold.  Is Patient Diabetic? No Pain Assessment Patient in pain? yes       Does patient need assistance? Functional Status Self care Ambulation Normal LMP (date): 11/11/2010     Menstrual Status regular Enter LMP: 11/11/2010   CC:  Having pain on back and sides of body and abd. area. Getting worse. Experiencing joint pains when it rains or when its cold. Marland Kitchen  History of Present Illness: 1.  Hyperesthesia:  No change in pain.  Pain is in the skin.  From bilateral axilla extending down lateral chest wall to pelvic girdle.  Upper thoracic back to about pelvic girdle.  Anterior abdominal area extending beneath breasts and sometimes extending to lower abdomen bilaterally.  Does not seem to involve anterior chest area or high thoracic back/cervical or neck area.  Sharp shooting pain intermittently.  If touched in involved areas, feels like is burned in that area--like someone is touching a sunburned area.  If scratches area, does note formation of bumps and redness where ever scratches.  All of current meds she has been taking regularly  Occasionally out in wooded areas, cannot recall when last out for certain.  Possibly at Christmas.  Pain started sometime after New Year's.  No history of tick bites, no rashes.  Cannot recall if ever tested for RPR, has tested negative for HIV in the past.  No recent nontender genital sores.  Has had molluscum contagiosum recently.    Current Medications  (verified): 1)  Vyvanse 70 Mg Caps (Lisdexamfetamine Dimesylate) .Marland Kitchen.. 1 Cap By Mouth Daily With Meal.  [facility] 2)  Lamictal 25 Mg Tabs (Lamotrigine) .Marland Kitchen.. 1 Tab By Mouth At [facility] 3)  Risperdal 2 Mg Tabs (Risperidone) .Marland Kitchen.. 1 Tab By Mouth At Bedtime 4)  Fluconazole 150 Mg Tabs (Fluconazole) .Marland Kitchen.. 1 Tab By Mouth For One Dose  Allergies (verified): 1)  ! * Bananas 2)  * Cats  Physical Exam  Neurologic:  Hypersensitivity to touch--first with pinprick at about T3/T4 level on back, but on sides of thorax to T2 level as involving axilla and upper inner arms bilaterally--seems to resolve slightly on inner forearm--T1.  With light touch, really doesn't start until T4 level. On low back, the hypersensitivity seems to extinguish at about level of L2 with pinprick and a bit higher with light touch Pinprick on abdomen noted as hypersensitive from about T7 down--but much less sensitive than on back. Skin:  No rash noted at beginning of exam.  A few minutes after testing prinprick with sharp wooden instrument, pt. developed well defined small wheals with raised papules.   X on back scratched very lightly with slight wheal where marks made.    Impression & Recommendations:  Problem # 1:  DISTURBANCE OF SKIN SENSATION (ICD-782.0) With significant dermagraphism--perhaps her discomfort is a pressure histamine response. Will treat with  Loratadine and see how she does. Cannot explain her levels of discomfort really from a neurologic standpoint as she does not have response at same level anteriorly Orders: T- * Misc. Laboratory test [phone]) T-Syphilis Test (RPR) [phone]) T-HIV Antibody  (Reflex) [id number])  Complete Medication List: 1)  Vyvanse 70 Mg Caps (Lisdexamfetamine dimesylate) .Marland Kitchen.. 1 cap by mouth daily with meal.  [facility] 2)  Lamictal 25 Mg Tabs (Lamotrigine) .Marland Kitchen.. 1 tab by mouth at bedtime--[facility] 3)  Risperdal 2 Mg Tabs (Risperidone) .Marland Kitchen.. 1 tab by  mouth at bedtime 4)  Loratadine 10 Mg Tabs (Loratadine) .Marland Kitchen.. 1 tab by mouth daily  Patient Instructions: 1)  Follow up with Dr. Delrae Alfred in 2 months --skin hypersensitvity Prescriptions: LORATADINE 10 MG TABS (LORATADINE) 1 tab by mouth daily  #30 x 11   Entered and Authorized by:   Julieanne Manson MD   Signed by:   Julieanne Manson MD on 11/18/2010   Method used:   Electronically to        [facility] [address] [phone]* (retail)       [address]       [place], [place]  [address]       Ph: [phone] or [phone]       Fax: [phone]   RxID:   [id number]    Orders Added: 1)  T- * Misc. Laboratory test [99999] 2)  T-Syphilis Test (RPR) [[id number]] 3)  T-HIV Antibody  (Reflex) [[id number]] 4)  Est. Patient Level III [55732]  Appended Document: Rapid HIV     Allergies: 1)  ! * Bananas 2)  * Cats   Complete Medication List: 1)  Vyvanse 70 Mg Caps (Lisdexamfetamine dimesylate) .Marland Kitchen.. 1 cap by mouth daily with meal.  [facility] 2)  Lamictal 25 Mg Tabs (Lamotrigine) .Marland Kitchen.. 1 tab by mouth at bedtime--[facility] 3)  Risperdal 2 Mg Tabs (Risperidone) .Marland Kitchen.. 1 tab by mouth at bedtime 4)  Loratadine 10 Mg Tabs (Loratadine) .Marland Kitchen.. 1 tab by mouth daily      Laboratory Results  Date/Time Received: November 18, 2010 2:15 PM   Other Tests  Rapid HIV: negative

## 2010-12-10 ENCOUNTER — Telehealth (INDEPENDENT_AMBULATORY_CARE_PROVIDER_SITE_OTHER): Payer: Self-pay | Admitting: Internal Medicine

## 2010-12-17 ENCOUNTER — Encounter (INDEPENDENT_AMBULATORY_CARE_PROVIDER_SITE_OTHER): Payer: Self-pay | Admitting: Internal Medicine

## 2010-12-17 LAB — CONVERTED CEMR LAB: Total CK: 55 units/L (ref 7–177)

## 2010-12-25 NOTE — Progress Notes (Signed)
Summary: pt still sick with pain  Phone Note Call from Patient Call back at Home Phone [phone]   Reason for Call: Acute Illness, Talk to Doctor Summary of Call: Pt said she is not any better. The meds given to her don't do anything, she still can't sleep, can't sit, etc.  Pls call asap Initial call taken by: Ayesha Rumpf,  December 10, 2010 3:56 PM  Follow-up for Phone Call        Still having pain, has worsened since last visit.  Is hurting in places that didn't hurt before, pain is diffuse.  Is now having leg and chest pain, arms, face, eyes.  Corners of eyes and bottom of feet burning.  Feels like she's on fire, makes her cry.  Having sharp, stabbing, burning, stinging, shooting pain.  Neurology appointment is 01/21/11.  Can only find relief if she gets in a position where she can be comfortable where she doesn't have to move is the only thing that helps at all, but pain still persists no matter what.  Allergy medicine doesn't help with the pain, just helps with the itch.  She's not sure what to do next to get relief from pain -- OV scheduled 12/16/10 in case she needs to be seen for f/u.   Follow-up by: Dutch Quint RN,  December 10, 2010 4:20 PM  Additional Follow-up for Phone Call Additional follow up Details #1::        Find out when she was started on all of her separate psych meds and if any of the dosages changed in past several weeks to months--before the pain started. Have her come in for CPK testing After getting info on her meds, will consider addition of Neurontin if nothing else obvious Additional Follow-up by: Julieanne Manson MD,  December 12, 2010 10:16 AM    Additional Follow-up for Phone Call Additional follow up Details #2::    Pt. uses [facility] on [address] [place]. [place].  Pt. had OV with provider this AM, forgot about it.  Scheduled tomorrow afternoon for CPK.  States that she's had no change in meds before pain started for quite a long time -- has been on psych  meds for years.  Call placed to [facility] for fill hx on meds - awaiting fax.  Dutch Quint RN  December 16, 2010 3:55 PM  Have spoken with pharmacy twice and requested faxed fill hx -- have yet to receive.  Dutch Quint RN  December 17, 2010 10:02 AM  [facility] pharmacy again.  Still waiting for fax.  Dutch Quint RN  December 17, 2010 1:06 PM  She needs to reschedule her OV soon. Let her know her muscle enzyme testing was okay.  Start gabapentin 300 mg at bedtime.  In 3 days, to increase to 600 mg at bedtime.   Julieanne Manson MD  December 18, 2010 5:26 AM   Rescheduled OV for 12/26/10.  Advised of lab results, new Rx with instructions for dosing.  Verbalized understanding and agreement.  Rx faxed to [facility] on [address] per pt. request.  Dutch Quint RN  December 18, 2010 9:05 AM   New/Updated Medications: GABAPENTIN 300 MG CAPS (GABAPENTIN) 1 cap by mouth at bedtime.  In 3 days, increase to 600 mg at bedtime. Prescriptions: GABAPENTIN 300 MG CAPS (GABAPENTIN) 1 cap by mouth at bedtime.  In 3 days, increase to 600 mg at bedtime.  #60 x 1   Entered and Authorized by:  Julieanne Manson MD   Signed by:   Julieanne Manson MD on 12/18/2010   Method used:   Printed then faxed to ...       [facility] [address] [phone]* (retail)       [address]       [place], [place]  [address]       Ph: [phone] or [phone]       Fax: [phone]   RxID:   [id number]

## 2010-12-26 ENCOUNTER — Telehealth (INDEPENDENT_AMBULATORY_CARE_PROVIDER_SITE_OTHER): Payer: Self-pay | Admitting: Internal Medicine

## 2010-12-26 ENCOUNTER — Encounter: Payer: Self-pay | Admitting: Internal Medicine

## 2010-12-26 ENCOUNTER — Encounter (INDEPENDENT_AMBULATORY_CARE_PROVIDER_SITE_OTHER): Payer: Self-pay | Admitting: Internal Medicine

## 2010-12-26 DIAGNOSIS — H9209 Otalgia, unspecified ear: Secondary | ICD-10-CM | POA: Insufficient documentation

## 2010-12-30 NOTE — Progress Notes (Signed)
Summary: Neuro referral  Phone Note Outgoing Call   Summary of Call: NOra--please send last several OV starting 10/23/10 and all labs since then as well with my referral letter I wrote today--to go to Neuro.  Has appt. on 4/25.  thanks. Initial call taken by: Julieanne Manson MD,  December 26, 2010 11:33 AM  Follow-up for Phone Call        i DID SEND THE OV 1-26-12AND ,REFERRAL THE LABS 10-24-10 IN ORDER FOR THEM TO SEE HER THE ONLY MISSING IS THE LETTER FOR TODAY  I WILL FAXED TODAY  Follow-up by: Cheryll Dessert,  December 26, 2010 12:06 PM

## 2010-12-30 NOTE — Letter (Signed)
Summary: *Referral Letter  [facility]  [address] [place], [place] [address]   Phone: [phone]  Fax: [phone]    12/26/2010  Thank you in advance for agreeing to see my patient:  [name] [address] [place], [place]  [address]  Phone: [phone]  Reason for Referral: Dysesthesias of initially trunk and now really all over.  CBC,CMET, Sed Rate, Lymes serology, CPK all normal.  No joint symptoms, no rash. Symptoms seem fairly random.  Does smoke MJ, but really started after initiation of pain.  Will send office notes and associated labs with letter.  She has improved with recent start of Gabapentin.  Procedures Requested: Evaluation and recommendations.  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  [facility] 2)  LAMICTAL 25 MG TABS (LAMOTRIGINE) 1 tab by mouth at bedtime--[facility] 3)  RISPERDAL 2 MG TABS (RISPERIDONE) 1 tab by mouth at bedtime 4)  LORATADINE 10 MG TABS (LORATADINE) 1 tab by mouth daily 5)  GABAPENTIN 300 MG CAPS (GABAPENTIN) 2 caps by mouth two times a day   Past Medical History: 1)  ALLERGIC RHINITIS (ICD-477.9) 2)  ASTHMA (ICD-493.90) 3)  POST TRAUMATIC STRESS SYNDROME (ICD-309.81) 4)  BIPOLAR DISORDER UNSPECIFIED (ICD-296.80) 5)  ATTENTION DEFICIT DISORDER (ICD-314.00)   Prior History of Blood Transfusions:   Pertinent Labs:    Thank you again for agreeing to see our patient; please contact us  if you have any further questions or need additional information.  Sincerely,  Julieanne Manson MD

## 2010-12-30 NOTE — Assessment & Plan Note (Signed)
Summary: f/u on skin/pain    Vital Signs:  Patient profile:   20 year old female Menstrual status:  regular LMP:     12/24/2010 Weight:      182.44 pounds Temp:     99.0 degrees F oral Pulse rate:   62 / minute Pulse rhythm:   regular Resp:     16 per minute BP sitting:   137 / 80  (left arm) Cuff size:   regular  Vitals Entered By: Hale Drone CMA (December 26, 2010 10:14 AM) CC: f/u on persistant pain/skin... Since taking Gabapentin, has been sleeping better and wakes up w/o pain but as the day goes on, will start to develop pain.  Is Patient Diabetic? No Pain Assessment Patient in pain? no       Does patient need assistance? Functional Status Self care Ambulation Normal LMP (date): 12/24/2010     Enter LMP: 12/24/2010   CC:  f/u on persistant pain/skin... Since taking Gabapentin, has been sleeping better and wakes up w/o pain but as the day goes on, and will start to develop pain. Marland Kitchen  History of Present Illness: 1.  Dysesthesias of unknown etiology:  Has been on Gabapentin at night for about 1 week.  Currently taking 600 mg at bedtime.  Awakening without significant pain.  Starts to note pain recurring even in morning.  Sounds like pain is everywhere now--even when washes hands, notes the discomfort.  Her back is still the area of most discomfort.  Pain is fairly random as to when and what part of body it affects.  Does use MJ regularly, but really was not using much before pain started--began using more regularly once developed more significant pain. Is sleeping well now and was not before starting Gabapentin.  2.  Ear Pain:  mainly left ear.  Went to Urgent Care some time ago--?months--diagnosed with Swimmer's ear.  Was treated.  Since then, intermittently with pain and sloshing feeling in ears.  Past 2 days, has had left ear discomfort.  Pain also with chewing.  Feels like something is in ear.  Hearing okay.    Current Medications (verified): 1)  Vyvanse 70 Mg Caps  (Lisdexamfetamine Dimesylate) .Marland Kitchen.. 1 Cap By Mouth Daily With Meal.  [facility] 2)  Lamictal 25 Mg Tabs (Lamotrigine) .Marland Kitchen.. 1 Tab By Mouth At [facility] 3)  Risperdal 2 Mg Tabs (Risperidone) .Marland Kitchen.. 1 Tab By Mouth At Bedtime 4)  Loratadine 10 Mg Tabs (Loratadine) .Marland Kitchen.. 1 Tab By Mouth Daily 5)  Gabapentin 300 Mg Caps (Gabapentin) .Marland Kitchen.. 1 Cap By Mouth At Bedtime.  in 3 Days, Increase To 600 Mg At Bedtime.  Allergies (verified): 1)  ! * Bananas 2)  * Cats  Physical Exam  General:  NAD Ears:  TMs clear/pearly gray.  No inflammation of canal or pain on manipulation of external ear.  Cut hairs in left ear canal.  NT over left TMJ.  No surrounding adenopathy Mouth:  pharynx pink and moist.   Neurologic:  MOves about much more easily today--no obvious pain.  Hands currently without tenderness.   Impression & Recommendations:  Problem # 1:  DISTURBANCE OF SKIN SENSATION (ICD-782.0) Doing better with Gabapentin Unable to find an etiology for her pain Has Neurology appt. 25th of April  Problem # 2:  EAR PAIN (ICD-388.70) No findings today. Discussed the noise in her ear could be the hairs --but would allow them to fall out on own. May also be having intermittent Eustachian Tube dysfunction.  Complete Medication List: 1)  Vyvanse 70 Mg Caps (Lisdexamfetamine dimesylate) .Marland Kitchen.. 1 cap by mouth daily with meal.  [facility] 2)  Lamictal 25 Mg Tabs (Lamotrigine) .Marland Kitchen.. 1 tab by mouth at bedtime--[facility] 3)  Risperdal 2 Mg Tabs (Risperidone) .Marland Kitchen.. 1 tab by mouth at bedtime 4)  Loratadine 10 Mg Tabs (Loratadine) .Marland Kitchen.. 1 tab by mouth daily 5)  Gabapentin 300 Mg Caps (Gabapentin) .... 2 caps by mouth two times a day  Patient Instructions: 1)  Start 300 mg of Gabapentin in morning and continue 600 mg at bedtime.  In 3 days, if still with pain during day, increase morning dose to 600 mg and stay on 600 mg at night. 2)  Follow up with Dr. Delrae Alfred in 6  weeks Prescriptions: GABAPENTIN 300 MG CAPS (GABAPENTIN) 2 caps by mouth two times a day  #120 x 0   Entered and Authorized by:   Julieanne Manson MD   Signed by:   Julieanne Manson MD on 12/26/2010   Method used:   Print then Give to Patient   RxID:   [id number]    Orders Added: 1)  Est. Patient Level III [95621]
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
## 2011-12-31 ENCOUNTER — Inpatient Hospital Stay (HOSPITAL_COMMUNITY)
Admission: AD | Admit: 2011-12-31 | Discharge: 2012-01-01 | Disposition: A | Discharge status: Home / Self Care | Payer: Medicare Other | Source: Ambulatory Visit | Attending: Obstetrics & Gynecology | Admitting: Obstetrics & Gynecology

## 2011-12-31 ENCOUNTER — Encounter (HOSPITAL_COMMUNITY): Payer: Self-pay | Admitting: *Deleted

## 2011-12-31 ENCOUNTER — Ambulatory Visit (INDEPENDENT_AMBULATORY_CARE_PROVIDER_SITE_OTHER): Payer: Medicaid Other | Admitting: Advanced Practice Midwife

## 2011-12-31 ENCOUNTER — Encounter: Payer: Self-pay | Admitting: Advanced Practice Midwife

## 2011-12-31 VITALS — BP 132/80 | HR 77 | Temp 98.3°F | Ht 69.0 in | Wt 166.1 lb

## 2011-12-31 DIAGNOSIS — Z30431 Encounter for routine checking of intrauterine contraceptive device: Secondary | ICD-10-CM | POA: Insufficient documentation

## 2011-12-31 DIAGNOSIS — Z3049 Encounter for surveillance of other contraceptives: Secondary | ICD-10-CM

## 2011-12-31 DIAGNOSIS — Z30430 Encounter for insertion of intrauterine contraceptive device: Secondary | ICD-10-CM

## 2011-12-31 DIAGNOSIS — R109 Unspecified abdominal pain: Secondary | ICD-10-CM | POA: Insufficient documentation

## 2011-12-31 DIAGNOSIS — N949 Unspecified condition associated with female genital organs and menstrual cycle: Secondary | ICD-10-CM | POA: Insufficient documentation

## 2011-12-31 DIAGNOSIS — Z01812 Encounter for preprocedural laboratory examination: Secondary | ICD-10-CM

## 2011-12-31 MED ORDER — IBUPROFEN 600 MG PO TABS: 600.0000 mg | ORAL_TABLET | Freq: Four times a day (QID) | ORAL | Status: AC | PRN

## 2011-12-31 MED ORDER — LEVONORGESTREL 20 MCG/24HR IU IUD
INTRAUTERINE_SYSTEM | Freq: Once | INTRAUTERINE | Status: AC
  Administered 2011-12-31: 1 via INTRAUTERINE

## 2011-12-31 NOTE — Progress Notes (Signed)
Per depression screen patient reports feeling sad sometimes, finds pleasure in life daily

## 2011-12-31 NOTE — Progress Notes (Signed)
Subjective:    [name] is a 21 y.o. female who presents for contraception counseling. The patient has no complaints today. The patient is sexually active. Last vaginal intercourse ~4 weeks ago. Patient's last menstrual period was 12/18/2011.  Pertinent past medical history: none.  Past Medical History  Diagnosis Date  . Nerve pain     hx shingles  . Asthma   . Shingles 2010  . PTSD (post-traumatic stress disorder)   . Depression   History reviewed. No pertinent past surgical history. Family History  Problem Relation Age of Onset  . Hepatitis Mother     C  . Hepatitis Sister   . Diabetes Sister      Menstrual History: OB History    Grav Para Term Preterm Abortions TAB SAB Ect Mult Living                  History   Social History  . Marital Status: Single    Spouse Name: N/A    Number of Children: N/A  . Years of Education: N/A   Occupational History  . Not on file.   Social History Main Topics  . Smoking status: Former Games developer  . Smokeless tobacco: Never Used  . Alcohol Use: No  . Drug Use: No  . Sexually Active: Yes    Birth Control/ Protection: Condom, Pill   Other Topics Concern  . Not on file   Social History Narrative  . No narrative on file    Patient's last menstrual period was 12/18/2011.    The following portions of the patient's history were reviewed and updated as appropriate: allergies, current medications, past family history, past medical history, past social history, past surgical history and problem list.  Review of Systems Pertinent items are noted in HPI.   Objective:    General appearance: alert, cooperative and no distress Lungs: clear to auscultation bilaterally Heart: regular rate and rhythm, S1, S2 normal, no murmur, click, rub or gallop Abdomen: soft, non-tender; bowel sounds normal; no masses,  no organomegaly Pelvic: cervix normal in appearance, external genitalia normal, no adnexal masses or tenderness, no  cervical motion tenderness, uterus normal size, shape, and consistency and vagina normal without discharge   Procedure note: Discussed increased risk of expulsion of IUDs in [place]. Offered Skyla for possibly easier insertion but shorter duration of action. Pt prefers Mirena.  Patient identified, informed consent performed, signed copy in chart, time out was performed.  Urine pregnancy test negative.  Speculum placed in the vagina.  Cervix visualized.  Cleaned with Betadine x 2.  Grasped anteriourly with a single tooth tenaculum.  Uterus sounded to 7 cm.  Mirena IUD placed per manufacturer's recommendations.  Strings trimmed to 3 cm.   Patient given post procedure instructions and Mirena care card with expiration date.  Patient is asked to check IUD strings periodically and follow up in 4-6 weeks for IUD check.   Assessment:    21 y.o., starting IUD, no contraindications.   Plan:    All questions answered. Breast self exam technique reviewed and patient encouraged to perform self-exam monthly. Chlamydia specimen. Follow up in 6 weeks. GC specimen.   1. Pre-procedure lab exam    2. Encounter for insertion of mirena IUD  GC/chlamydia probe amp, genital, ibuprofen (MOTRIN IB) 600 MG tablet  3. Surveillance of other previously prescribed contraceptive method  levonorgestrel (MIRENA) 20 MCG/24HR IUD   Dorathy Kinsman 12/31/2011 7:43 PM

## 2011-12-31 NOTE — MAU Note (Signed)
SPOTTING- HAS PAD ON  - PUT ON 5PM

## 2011-12-31 NOTE — MAU Note (Signed)
PT SAYS  SHE  WENT   TO CLINIC TODAY  TO  GET IUD PLACED.    CNM SAID   IF TOO MUCH BLEEDING - AND TAKE IBUPROFEN .   TOOK   IBUPROFEN  - 800MG  AT 450PM.  AT 7PM   400MG  MORE.    AT 9PM TOOK 1000MG  TYLENOL.   LITTLE RELIEF.  SHOULDER HURTS-  930PM.

## 2012-01-01 ENCOUNTER — Encounter (HOSPITAL_COMMUNITY): Payer: Self-pay | Admitting: *Deleted

## 2012-01-01 ENCOUNTER — Inpatient Hospital Stay (HOSPITAL_COMMUNITY): Payer: Medicare Other

## 2012-01-01 LAB — GC/CHLAMYDIA PROBE AMP, GENITAL: GC Probe Amp, Genital: NEGATIVE

## 2012-01-01 NOTE — Discharge Instructions (Signed)
Intrauterine Device Information An intrauterine device (IUD) is inserted into your uterus and prevents pregnancy. There are 2 types of IUDs available:  Copper IUD. This type of IUD is wrapped in copper wire and is placed inside the uterus. Copper makes the uterus and fallopian tubes produce a fluid that kills sperm. The copper IUD can stay in place for 10 years.   Hormone IUD. This type of IUD contains the hormone progestin (synthetic progesterone). The hormone thickens the cervical mucus and prevents sperm from entering the uterus, and it also thins the uterine lining to prevent implantation of a fertilized egg. The hormone can weaken or kill the sperm that get into the uterus. The hormone IUD can stay in place for 5 years.  Your caregiver will make sure you are a good candidate for a contraceptive IUD. Discuss with your caregiver the possible side effects. ADVANTAGES  It is highly effective, reversible, long-acting, and low maintenance.   There are no estrogen-related side effects.   An IUD can be used when breastfeeding.   It is not associated with weight gain.   It works immediately after insertion.   The copper IUD does not interfere with your female hormones.   The progesterone IUD can make heavy menstrual periods lighter.   The progesterone IUD can be used for 5 years.   The copper IUD can be used for 10 years.  DISADVANTAGES  The progesterone IUD can be associated with irregular bleeding patterns.   The copper IUD can make your menstrual flow heavier and more painful.   You may experience cramping and vaginal bleeding after insertion.  Document Released: 08/18/2004 Document Revised: 09/03/2011 Document Reviewed: 01/17/2011 ExitCare Patient Information 2012 [employer]. 

## 2012-01-01 NOTE — MAU Provider Note (Signed)
History     CSN: [id number]  Arrival date and time: 12/31/11 2247   First Provider Initiated Contact with Patient 01/01/12 0114      No chief complaint on file.  HPI 21 year old nullipara had Mirena IUD inserted at GYN clinic about 8 hours ago and is having severe cramping pain. She's taken aspirin and ibuprofen without alleviation of the pain. Denies bleeding  OB History    Grav Para Term Preterm Abortions TAB SAB Ect Mult Living   0               Past Medical History  Diagnosis Date  . Nerve pain     hx shingles  . Asthma   . Shingles 2010  . PTSD (post-traumatic stress disorder)   . Depression     History reviewed. No pertinent past surgical history.  Family History  Problem Relation Age of Onset  . Hepatitis Mother     C  . Hepatitis Sister   . Diabetes Sister     History  Substance Use Topics  . Smoking status: Former Games developer  . Smokeless tobacco: Never Used  . Alcohol Use: No    Allergies:  Allergies  Allergen Reactions  . Food Anaphylaxis    Bananas, watermelon, grapefruit, cantaloupe, avocado    Prescriptions prior to admission  Medication Sig Dispense Refill  . acetaminophen (TYLENOL) 500 MG tablet Take 1,000 mg by mouth every 6 (six) hours as needed. For pain      . ibuprofen (MOTRIN IB) 600 MG tablet Take 1 tablet (600 mg total) by mouth every 6 (six) hours as needed for pain.  30 tablet  1  . pregabalin (LYRICA) 75 MG capsule Take 75 mg by mouth 2 (two) times daily.        ROS Physical Exam   Blood pressure 120/79, pulse 79, temperature 97.7 F (36.5 C), temperature source Oral, resp. rate 20, height 5\' 9"  (1.753 m), weight 76.715 kg (169 lb 2 oz), last menstrual period 12/18/2011.  Physical Exam  Gen: Mild distress Head: Normocephalic Neck: supple Heart: Normal rate ABD: soft, minimal suprapubic tenderness Pelvic: No blood seen, Mirena IUD strings 2.5 cm from external os, mild discomfort with bimanual, uterus NSSP   MAU Course    Procedures Clinical Data: IUD placement. Pain.  TRANSABDOMINAL AND TRANSVAGINAL ULTRASOUND OF PELVIS  Technique: Both transabdominal and transvaginal ultrasound  examinations of the pelvis were performed. Transabdominal technique  was performed for global imaging of the pelvis including uterus,  ovaries, adnexal regions, and pelvic cul-de-sac.  Comparison: None.  It was necessary to proceed with endovaginal exam following the  transabdominal exam to visualize the ovaries and endometrium.  Findings:  Uterus: Measures 7.1 x 2.9 x 3.6 cm. No myometrial abnormalities.  Endometrium: The IUD is visualized in the endometrial canal. It  appears to be normally positioned. Normal endometrial thickness.  Right ovary: Measures 2.8 x 1.8 x 2.2 cm. No cysts or masses.  Left ovary: Measures 3.6 x 1.9 x 2.4 cm. No cysts or masses.  Other findings: Trace free pelvic fluid.  IMPRESSION:  1. The IUD appears to be normally located in the endometrial  canal. Normal endometrial stripe.  2. Normal ovaries.  3. Trace free pelvic fluid.     Assessment and Plan  Pelvic discomfort status post IUD insertion earlier today IUD in normal location Patient reassured and will take ibuprofen 600 mg every 6 hours for 24 hours, then as needed Keep followup appointment at GYN clinic  [name] 01/01/2012, 1:29 AM

## 2012-01-05 NOTE — Patient Instructions (Signed)
Intrauterine Device Insertion Care After Refer to this sheet in the next few weeks. These instructions provide you with information on caring for yourself after your procedure. Your caregiver may also give you more specific instructions. Your treatment has been planned according to current medical practices, but problems sometimes occur. Call your caregiver if you have any problems or questions after your procedure. HOME CARE INSTRUCTIONS   Only take over-the-counter or prescription medicines for pain, discomfort, or fever as directed by your caregiver. Do not use aspirin. This may increase bleeding.   Check your IUD to make sure it is in place before you resume sexual activity. You should be able to feel the strings. If you cannot feel the strings, something may be wrong. The IUD may have fallen out of the uterus, or the uterus may have been punctured (perforated) during placement. Also, if the strings are getting longer, it may mean that the IUD is being forced out of the uterus. You no longer have full protection from pregnancy if any of these problems occur.   You may resume sexual intercourse if you are not having problems with the IUD. The IUD is considered immediately effective.   You may resume normal activities.   Keep all follow-up appointments to be sure your IUD has remained in place. After the first exam, yearly exams are advised, unless you cannot feel the strings of your IUD.   Continue to check that the IUD is still in place by feeling for the strings after every menstrual period.  SEEK MEDICAL CARE IF:   You have bleeding that is heavier or lasts longer than a normal menstrual cycle.   You have a fever.   You have increasing cramps or abdominal pain not relieved with medicine.   You have abdominal pain that does not seem to be related to the same area of earlier cramping and pain.   You are lightheaded, unusually weak, or faint.   You have abnormal vaginal discharge or  smells.   You have pain during sexual intercourse.   You cannot feel the IUD strings, or the IUD string has gotten longer.   You feel the IUD at the opening of the cervix in the vagina.   You think you are pregnant, or you miss your menstrual period.   The IUD string is hurting your sex partner.  Document Released: 05/13/2011 Document Revised: 09/03/2011 Document Reviewed: 05/13/2011 ExitCare Patient Information 2012 [employer]. 

## 2012-02-17 ENCOUNTER — Ambulatory Visit (INDEPENDENT_AMBULATORY_CARE_PROVIDER_SITE_OTHER): Payer: Medicare Other | Admitting: Advanced Practice Midwife

## 2012-02-17 ENCOUNTER — Encounter: Payer: Self-pay | Admitting: Advanced Practice Midwife

## 2012-02-17 VITALS — BP 138/96 | HR 84 | Temp 99.0°F | Wt 161.9 lb

## 2012-02-17 DIAGNOSIS — Z30431 Encounter for routine checking of intrauterine contraceptive device: Secondary | ICD-10-CM

## 2012-02-17 DIAGNOSIS — R030 Elevated blood-pressure reading, without diagnosis of hypertension: Secondary | ICD-10-CM

## 2012-02-17 NOTE — Patient Instructions (Signed)

## 2012-02-17 NOTE — Progress Notes (Signed)
Subjective:     [name] is a 21 y.o. female here for a check of her IUD.  Current complaints:None. Gynecologic History Patient's last menstrual period was 01/19/2012. Contraception: IUD Last Pap: None.  Obstetric History OB History    Grav Para Term Preterm Abortions TAB SAB Ect Mult Living   0                Review of Systems Pertinent items are noted in HPI.    Objective:    Pelvic: EGBUS normal Vagina clear with small amount bloody discharge, no odor Cervix nulliparous, IUD strings visible.    Assessment:    Healthy female exam.   Normal IUD  Plan:    Education reviewed: IUD expected effect on periods. Contraception: IUD. Follow up in: 6 months. for Annual and Pap. Pt wants to be tested for HPV

## 2012-06-08 ENCOUNTER — Encounter (HOSPITAL_COMMUNITY): Payer: Self-pay

## 2012-06-08 ENCOUNTER — Emergency Department (INDEPENDENT_AMBULATORY_CARE_PROVIDER_SITE_OTHER)
Admission: EM | Admit: 2012-06-08 | Discharge: 2012-06-08 | Disposition: A | Discharge status: Home / Self Care | Payer: Medicare Other | Source: Home / Self Care | Attending: Emergency Medicine | Admitting: Emergency Medicine

## 2012-06-08 DIAGNOSIS — N61 Mastitis without abscess: Secondary | ICD-10-CM

## 2012-06-08 DIAGNOSIS — T148XXA Other injury of unspecified body region, initial encounter: Secondary | ICD-10-CM

## 2012-06-08 MED ORDER — SULFAMETHOXAZOLE-TRIMETHOPRIM 800-160 MG PO TABS: 1.0000 | ORAL_TABLET | Freq: Two times a day (BID) | ORAL | Status: AC

## 2012-06-08 NOTE — ED Provider Notes (Signed)
History     CSN: [id number]  Arrival date & time 06/08/12  1452   First MD Initiated Contact with Patient 06/08/12 1503      Chief Complaint  Patient presents with  . Cellulitis    (Consider location/radiation/quality/duration/timing/severity/associated sxs/prior treatment) HPI Comments: Patient reports removing right nipple body jewelry several months ago, reports pain, swelling lateral part of the nipple where the piercing used to be starting several days ago. No aggravating or alleviating factors. She has not tried anything for this. No recent trauma to the breast or nipple. No nipple drainage, breast erythema, breast swelling, breast tenderness. No nausea, vomiting, fevers. She is not a diabetic. She is a former smoker.   ROS as noted in HPI. All other ROS negative.   The history is provided by the patient. No language interpreter was used.    Past Medical History  Diagnosis Date  . Nerve pain     hx shingles  . Asthma   . Shingles 2010  . PTSD (post-traumatic stress disorder)   . Depression     History reviewed. No pertinent past surgical history.  Family History  Problem Relation Age of Onset  . Hepatitis Mother     C  . Hepatitis Sister   . Diabetes Sister     History  Substance Use Topics  . Smoking status: Former Games developer  . Smokeless tobacco: Never Used  . Alcohol Use: No    OB History    Grav Para Term Preterm Abortions TAB SAB Ect Mult Living   0               Review of Systems  Allergies  Food  Home Medications   Current Outpatient Rx  Name Route Sig Dispense Refill  . ACETAMINOPHEN 500 MG PO TABS Oral Take 1,000 mg by mouth every 6 (six) hours as needed. For pain    . PREGABALIN 75 MG PO CAPS Oral Take 75 mg by mouth 2 (two) times daily.    . SULFAMETHOXAZOLE-TRIMETHOPRIM 800-160 MG PO TABS Oral Take 1 tablet by mouth 2 (two) times daily. X 10 days 20 tablet 0    BP 123/73  Pulse 76  Temp 99.8 F (37.7 C) (Oral)  Resp 16  SpO2  99%  LMP 04/07/2012  Physical Exam  Nursing note and vitals reviewed. Constitutional: She is oriented to person, place, and time. She appears well-developed and well-nourished. No distress.  HENT:  Head: Normocephalic and atraumatic.  Eyes: Conjunctivae normal and EOM are normal.  Neck: Normal range of motion.  Cardiovascular: Normal rate.   Pulmonary/Chest: Effort normal. Right breast exhibits no skin change and no tenderness. Breasts are symmetrical.         Small tender swelling over healed nipple piercing, expressible purulent drainage from the piercing. No expressible nipple discharge. No breast erythema, tenderness.  Abdominal: She exhibits no distension.  Musculoskeletal: Normal range of motion.  Neurological: She is alert and oriented to person, place, and time. Coordination normal.  Skin: Skin is warm and dry.  Psychiatric: She has a normal mood and affect. Her behavior is normal. Judgment and thought content normal.    ED Course  Procedures (including critical care time)   Labs Reviewed  CULTURE, ROUTINE-ABSCESS   No results found.   1. Pierced nipple infection     MDM  Performed local wound care, bacitracin, dressing. Sending purulent material for culture. Starting on Bactrim. No evidence of mastitis, lymphangitis, systemic involvement. Referring to primary care resources for  routine medical care. Patient to give [place] a working phone number if any change or antibiotics. Warm compresses, antibacterial soap, local wound care.. Discussed signs and symptoms that should prompt return to the department. Patient agrees with plan.  Luiz Blare, MD 06/08/12 [phone]

## 2012-06-08 NOTE — ED Notes (Signed)
Infected piercing ; removed same a couple of days ago

## 2012-06-12 LAB — CULTURE, ROUTINE-ABSCESS: Culture: NO GROWTH

## 2012-10-31 ENCOUNTER — Ambulatory Visit: Payer: Medicare Other | Admitting: Obstetrics & Gynecology

## 2012-11-17 ENCOUNTER — Encounter: Payer: Self-pay | Admitting: Medical

## 2012-11-17 ENCOUNTER — Other Ambulatory Visit (HOSPITAL_COMMUNITY)
Admission: RE | Admit: 2012-11-17 | Discharge: 2012-11-17 | Disposition: A | Discharge status: Home / Self Care | Payer: Medicare Other | Source: Ambulatory Visit | Attending: Medical | Admitting: Medical

## 2012-11-17 ENCOUNTER — Ambulatory Visit (INDEPENDENT_AMBULATORY_CARE_PROVIDER_SITE_OTHER): Payer: Medicare Other | Admitting: Medical

## 2012-11-17 VITALS — BP 123/79 | HR 74 | Temp 98.4°F | Ht 69.0 in | Wt 162.1 lb

## 2012-11-17 DIAGNOSIS — N760 Acute vaginitis: Secondary | ICD-10-CM | POA: Insufficient documentation

## 2012-11-17 DIAGNOSIS — N341 Nonspecific urethritis: Secondary | ICD-10-CM

## 2012-11-17 DIAGNOSIS — Z124 Encounter for screening for malignant neoplasm of cervix: Secondary | ICD-10-CM | POA: Insufficient documentation

## 2012-11-17 DIAGNOSIS — Z01419 Encounter for gynecological examination (general) (routine) without abnormal findings: Secondary | ICD-10-CM

## 2012-11-17 DIAGNOSIS — Z113 Encounter for screening for infections with a predominantly sexual mode of transmission: Secondary | ICD-10-CM | POA: Insufficient documentation

## 2012-11-17 DIAGNOSIS — N898 Other specified noninflammatory disorders of vagina: Secondary | ICD-10-CM

## 2012-11-17 LAB — POCT URINALYSIS DIP (DEVICE)
Glucose, UA: NEGATIVE mg/dL
Nitrite: NEGATIVE
Specific Gravity, Urine: 1.02 (ref 1.005–1.030)

## 2012-11-17 NOTE — Progress Notes (Signed)
Patient ID: [name], female   DOB: [birth year], 22 y.o.   MRN: [id number] Subjective:    [name] is a 22 y.o. female who presents for an annual exam. The patient has no complaints today. The patient is sexually active. GYN screening history: no prior history of gyn screening tests. The patient wears seatbelts: yes. The patient participates in regular exercise: yes. Has the patient ever been transfused or tattooed?: yes. Multiple tattoos and piercings. The patient reports that there is not domestic violence in her life currently. The patient has some discharge, unchanged since IUD placement. She denies irregular bleeding. She has an IUD and uses condoms. BMs normal. The patient states that she has some pressure with urination and increased frequency and urgency. She denies dysuria.   Menstrual History: OB History   Grav Para Term Preterm Abortions TAB SAB Ect Mult Living   0               Menarche age: 66 years No LMP recorded. Patient is not currently having periods (Reason: IUD).    The following portions of the patient's history were reviewed and updated as appropriate: allergies, current medications, past family history, past medical history, past social history, past surgical history and problem list.  Review of Systems Pertinent items are noted in HPI.    Objective:     BP 123/79  Pulse 74  Temp(Src) 98.4 F (36.9 C) (Oral)  Ht 5\' 9"  (1.753 m)  Wt 162 lb 1.6 oz (73.528 kg)  BMI 23.93 kg/m2 GENERAL: Well-developed, well-nourished female in no acute distress.  HEENT: Normocephalic, atraumatic. Sclerae anicteric.  LUNGS: Clear to auscultation bilaterally.  HEART: Regular rate and rhythm. BREASTS: Symmetric in size. No masses, skin changes, nipple drainage, or lymphadenopathy. Left areola has bar piercing. No surrounding erythema or edema.  ABDOMEN: Soft, nontender, nondistended. No organomegaly. PELVIC: Normal external female genitalia. Vagina is pink and  rugated.  Normal discharge. Normal cervix contour. Pap smear and Aptima swab obtained. Uterus is normal in size. No adnexal mass or tenderness.  EXTREMITIES: No cyanosis, clubbing, or edema.  LABS: UA - normal Pap, aptima, HIV, RPR, Hep B and Hep C obtained today   Assessment:    Healthy female exam.    Plan:  STD testing today. Patient will be called with any abnormal results Patient will receive a letter if pap is normal Patient will follow-up in 1 year for annual exam or sooner if needed

## 2012-11-17 NOTE — Patient Instructions (Addendum)
Pap Test A Pap test checks the cells on the surface of your cervix. Your doctor will look for cell changes that are not normal, an infection, or cancer. If the cells no longer look normal, it is called dysplasia. Dysplasia can turn into cancer. Regular Pap tests are important to stop cancer from developing. BEFORE THE PROCEDURE  Ask your doctor when to schedule your Pap test. Timing the test around your period may be important.  Do not douche or have sex (intercourse) for 24 hours before the test.  Do not put creams on your vagina or use tampons for 24 hours before the test.  Go pee (urinate) just before the test. PROCEDURE  You will lie on an exam table with your feet in stirrups.  A warm metal or plastic tool (speculum) will be put in your vagina to open it up.  Your doctor will use a small, plastic brush or wooden spatula to take cells from your cervix.  The cells will be put in a lab container.  The cells will be checked under a microscope to see if they are normal or not. AFTER THE PROCEDURE Get your test results. If they are abnormal, you may need more tests. Document Released: 10/17/2010 Document Revised: 12/07/2011 Document Reviewed: 09/10/2011 ExitCare Patient Information 2013 [employer].  

## 2012-11-18 LAB — HEPATITIS C ANTIBODY: HCV Ab: NEGATIVE

## 2012-11-18 LAB — RPR

## 2012-11-23 ENCOUNTER — Other Ambulatory Visit: Payer: Self-pay | Admitting: Medical

## 2012-11-23 ENCOUNTER — Telehealth: Payer: Self-pay | Admitting: General Practice

## 2012-11-23 DIAGNOSIS — B9689 Other specified bacterial agents as the cause of diseases classified elsewhere: Secondary | ICD-10-CM

## 2012-11-23 MED ORDER — METRONIDAZOLE 500 MG PO TABS: 500.0000 mg | ORAL_TABLET | Freq: Two times a day (BID) | ORAL | Status: DC

## 2012-11-23 NOTE — Telephone Encounter (Signed)
Message copied by Kathee Delton on Wed Nov 23, 2012  9:08 AM ------      Message from: Freddi Starr      Created: Wed Nov 23, 2012  8:55 AM       Pt has BV. Rx for Flagyl sent to patient's pharmacy. Please inform patient of dx and Rx. Thanks! ------

## 2012-11-23 NOTE — Telephone Encounter (Signed)
Called patient and informed her of results and medication that she will take twice a day for one week and that that Rx was sent to [facility] on [address]. Patient verbalized understanding and had no further questions

## 2013-06-05 ENCOUNTER — Emergency Department (INDEPENDENT_AMBULATORY_CARE_PROVIDER_SITE_OTHER)
Admission: EM | Admit: 2013-06-05 | Discharge: 2013-06-05 | Disposition: A | Discharge status: Home / Self Care | Payer: Medicare Other | Source: Home / Self Care | Attending: Family Medicine | Admitting: Family Medicine

## 2013-06-05 ENCOUNTER — Encounter (HOSPITAL_COMMUNITY): Payer: Self-pay

## 2013-06-05 DIAGNOSIS — B9689 Other specified bacterial agents as the cause of diseases classified elsewhere: Secondary | ICD-10-CM

## 2013-06-05 DIAGNOSIS — A499 Bacterial infection, unspecified: Secondary | ICD-10-CM

## 2013-06-05 DIAGNOSIS — L089 Local infection of the skin and subcutaneous tissue, unspecified: Secondary | ICD-10-CM

## 2013-06-05 MED ORDER — SULFAMETHOXAZOLE-TRIMETHOPRIM 800-160 MG PO TABS: 1.0000 | ORAL_TABLET | Freq: Two times a day (BID) | ORAL | Status: DC

## 2013-06-05 NOTE — ED Notes (Addendum)
Reports 4 day duration of swelling in lymph nodes both axilla, reportedly pain w ROM arms and palpation . Denies ST, denies GI, GU syx, vague HA worse w bending over, no dental issues, minimal redness posterior nasopharynx (post tonsillectomy) uvula midline; NAD

## 2013-06-05 NOTE — ED Provider Notes (Signed)
Medical screening examination/treatment/procedure(s) were performed by resident physician or non-physician practitioner and as supervising physician I was immediately available for consultation/collaboration.   Raisa Ditto DOUGLAS MD.   Cody Albus D Kourtland Coopman, MD 06/05/13 2023 

## 2013-06-05 NOTE — ED Provider Notes (Signed)
CSN: [id number]     Arrival date & time 06/05/13  1334 History   First MD Initiated Contact with Patient 06/05/13 1523     Chief Complaint  Patient presents with  . Lymphadenopathy   (Consider location/radiation/quality/duration/timing/severity/associated sxs/prior Treatment) HPI Comments: Pt c/o 4 days of painful swollen lymph nodes in B axilla. Started in R, then 2 days later started in L . Denies injury, rash/skin irritation. Denies new deodorant or topical product to area. Has not tried anything to help problem get better. Worsens with movement of arms or pushing on sore areas.   The history is provided by the patient.    Past Medical History  Diagnosis Date  . Nerve pain     hx shingles  . Asthma   . Shingles 2010  . PTSD (post-traumatic stress disorder)   . Depression    History reviewed. No pertinent past surgical history. Family History  Problem Relation Age of Onset  . Hepatitis Mother     C  . Hepatitis Sister   . Diabetes Sister    History  Substance Use Topics  . Smoking status: Former Games developer  . Smokeless tobacco: Never Used  . Alcohol Use: No   OB History   Grav Para Term Preterm Abortions TAB SAB Ect Mult Living   0              Review of Systems  Constitutional: Negative for fever and chills.  Skin: Negative for color change and wound.       Swollen tender axillary lymph nodes    Allergies  Food  Home Medications   Current Outpatient Rx  Name  Route  Sig  Dispense  Refill  . pregabalin (LYRICA) 75 MG capsule   Oral   Take 75 mg by mouth 3 (three) times daily.          . risperiDONE (RISPERDAL) 0.5 MG tablet   Oral   Take 0.5 mg by mouth daily.         . risperidone (RISPERDAL) 4 MG tablet   Oral   Take 5 mg by mouth daily.         . metroNIDAZOLE (FLAGYL) 500 MG tablet   Oral   Take 1 tablet (500 mg total) by mouth 2 (two) times daily.   14 tablet   0    BP 106/70  Pulse 72  Temp(Src) 98.8 F (37.1 C) (Oral)  Resp 16   SpO2 98% Physical Exam  Constitutional: She appears well-developed and well-nourished. No distress.  Lymphadenopathy:    She has axillary adenopathy.       Right axillary: Lateral adenopathy present.       Left axillary: Lateral adenopathy present.  Skin: Skin is warm, dry and intact.  Tiny 3mm erythematous bumps R and L axilla c/w tiny bacterial infections/early abscesses.     ED Course  Procedures (including critical care time) Labs Review Labs Reviewed - No data to display Imaging Review No results found.  MDM   1. Bacterial skin infection    Uncertain if these are the result of ingrown hairs or some other source. Pt to use warm compresses 2-3 times per day. Rx bactrim DS q12 hours #20.    Cathlyn Parsons, NP 06/05/13 [phone]

## 2013-07-03 ENCOUNTER — Encounter: Payer: Self-pay | Admitting: Obstetrics and Gynecology

## 2013-07-03 ENCOUNTER — Ambulatory Visit (INDEPENDENT_AMBULATORY_CARE_PROVIDER_SITE_OTHER): Payer: Medicare Other | Admitting: Obstetrics and Gynecology

## 2013-07-03 VITALS — BP 100/70 | HR 103 | Ht 69.0 in | Wt 181.2 lb

## 2013-07-03 DIAGNOSIS — Z113 Encounter for screening for infections with a predominantly sexual mode of transmission: Secondary | ICD-10-CM

## 2013-07-03 LAB — HEPATITIS B SURFACE ANTIGEN: Hepatitis B Surface Ag: NEGATIVE

## 2013-07-03 NOTE — Progress Notes (Signed)
Patient ID: [name], female   DOB: 1991-05-12, 22 y.o.   MRN: [id number] 22 yo G0P0 presenting today for STD screening. Patient denies any recent exposure to STDs and only wants "to make sure". Patient still using Mirena and condoms for contraception  GENERAL: Well-developed, well-nourished female in no acute distress.  ABDOMEN: Soft, nontender, nondistended. No organomegaly. PELVIC: Normal external female genitalia. Vagina is pink and rugated.  Normal discharge. Normal appearing cervix. Uterus is normal in size. No adnexal mass or tenderness. EXTREMITIES: No cyanosis, clubbing, or edema, 2+ distal pulses.  A/P 22 yo here for STD screening - cultures collected - HIV, Hep B, Hep C and RPR ordered - Patient will be contacted with any abnormal results - RTC for annual exam

## 2014-05-02 ENCOUNTER — Encounter (HOSPITAL_COMMUNITY): Payer: Self-pay | Admitting: Emergency Medicine

## 2014-05-02 ENCOUNTER — Emergency Department (INDEPENDENT_AMBULATORY_CARE_PROVIDER_SITE_OTHER)
Admission: EM | Admit: 2014-05-02 | Discharge: 2014-05-02 | Disposition: A | Discharge status: Home / Self Care | Payer: Medicare Other | Source: Home / Self Care

## 2014-05-02 DIAGNOSIS — R21 Rash and other nonspecific skin eruption: Secondary | ICD-10-CM

## 2014-05-02 MED ORDER — PREDNISONE (PAK) 10 MG PO TABS: ORAL_TABLET | Freq: Every day | ORAL | Status: DC

## 2014-05-02 MED ORDER — PERMETHRIN 5 % EX CREA: 1.0000 "application " | TOPICAL_CREAM | Freq: Once | CUTANEOUS | Status: DC

## 2014-05-02 NOTE — Discharge Instructions (Signed)
The source of your rash is not clear. THis may be related to something you've come in contact with or your cats.  Please start the steroids for the rash. You can also try benadryl for the itch Please start the permethrine cream if your rash does not clear for scabies.

## 2014-05-02 NOTE — ED Provider Notes (Signed)
CSN: [id number]     Arrival date & time 05/02/14  1325 History   None    Chief Complaint  Patient presents with  . Rash   (Consider location/radiation/quality/duration/timing/severity/associated sxs/prior Treatment) HPI  Rash: started on R shoulder 2 days ago. Resolved that night. Now w/ rash on stomach and spread up to chest. Resolved again this morning but then came back later today over torso. Itchy. Has not tried any medications for this. Denies trying any new foods, soaps, lotions, detergent. Denies environmental exposures. House mate w/o rash. Denies fevers, n/v/ CP, SOB. No sick contacts.    Past Medical History  Diagnosis Date  . Nerve pain     hx shingles  . Asthma   . Shingles 2010  . PTSD (post-traumatic stress disorder)   . Depression    History reviewed. No pertinent past surgical history. Family History  Problem Relation Age of Onset  . Hepatitis Mother     C  . Hepatitis Sister   . Diabetes Sister    History  Substance Use Topics  . Smoking status: Former Games developermoker  . Smokeless tobacco: Never Used  . Alcohol Use: No   OB History   Grav Para Term Preterm Abortions TAB SAB Ect Mult Living   0              Review of Systems Per HPI with all other pertinent systems negative.   Allergies  Food  Home Medications   Prior to Admission medications   Medication Sig Start Date End Date Taking? Authorizing Provider  buPROPion (WELLBUTRIN XL) 300 MG 24 hr tablet Take 300 mg by mouth daily.    Historical Provider, MD  levonorgestrel (MIRENA) 20 MCG/24HR IUD 1 each by Intrauterine route once.    Historical Provider, MD  permethrin (ELIMITE) 5 % cream Apply 1 application topically once. Apply at night and wash off in the morning and then repeat in 14 days 05/02/14   Ozella Rocksavid J Merrell, MD  predniSONE (STERAPRED UNI-PAK) 10 MG tablet Take by mouth daily. Take 5 for 5 days, 4 for 3 days, 3 for 2 days, 2 for 1 day, 1 for 1 day 05/02/14   Ozella Rocksavid J Merrell, MD  pregabalin  (LYRICA) 75 MG capsule Take 75 mg by mouth 3 (three) times daily.     Historical Provider, MD  risperiDONE (RISPERDAL) 0.5 MG tablet Take 0.5 mg by mouth daily.    Historical Provider, MD  risperidone (RISPERDAL) 4 MG tablet Take 5 mg by mouth daily.    Historical Provider, MD   BP 115/75  Pulse 93  Temp(Src) 98.2 F (36.8 C) (Oral)  Resp 18  SpO2 97% Physical Exam  Constitutional: She is oriented to person, place, and time. She appears well-developed and well-nourished. No distress.  HENT:  Head: Normocephalic and atraumatic.  Eyes: EOM are normal. Pupils are equal, round, and reactive to light.  Neck: Normal range of motion.  Cardiovascular: Normal rate, normal heart sounds and intact distal pulses.  Exam reveals no gallop.   No murmur heard. Pulmonary/Chest: Effort normal and breath sounds normal. No respiratory distress.  Abdominal: Soft. She exhibits no distension.  Musculoskeletal: Normal range of motion. She exhibits no edema and no tenderness.  Neurological: She is alert and oriented to person, place, and time. No cranial nerve deficit. Coordination normal.  Skin: Skin is warm. She is not diaphoretic.  Waist line and upper abdomen to inferior breast folds w/ mild macular papular lesions snd general rough feeling to  the skin.   Psychiatric: She has a normal mood and affect. Her behavior is normal. Judgment and thought content normal.    ED Course  Procedures (including critical care time) Labs Review Labs Reviewed - No data to display  Imaging Review No results found.   MDM   1. Rash    Allergic Dermatitis. Unknown causative agent.  Prednisone dose pack Benadryl po PRN If not clearing or bed friend develops symptoms then start permethrine cream for possible scabies.  Precautions given and all questions answered  Shelly Flatten, MD Family Medicine 05/02/2014, 2:51 PM      Ozella Rocks, MD 05/02/14 [phone]

## 2014-05-02 NOTE — ED Notes (Signed)
Pt   Has  A   Rash  On  Abd         For   sev      Days     She  Reports  It  Itches           She denys  Any      New  meds         She    denys  Any  Known     Causative  Agents     She     Is   In no   Acute  Distress

## 2015-02-08 ENCOUNTER — Telehealth: Payer: Self-pay | Admitting: General Practice

## 2015-02-08 NOTE — Telephone Encounter (Signed)
Patient called and left message stating she wants to know when her IUD was placed so she knows when it needs to be removed. Called patient and informed her that her IUD will need to be removed April 2018. Patient verbalized understanding and had no other questions

## 2015-03-31 ENCOUNTER — Emergency Department (HOSPITAL_COMMUNITY): Payer: Medicare Other

## 2015-03-31 ENCOUNTER — Emergency Department (HOSPITAL_COMMUNITY)
Admission: EM | Admit: 2015-03-31 | Discharge: 2015-03-31 | Disposition: A | Discharge status: Home / Self Care | Payer: Medicare Other | Attending: Emergency Medicine | Admitting: Emergency Medicine

## 2015-03-31 ENCOUNTER — Encounter (HOSPITAL_COMMUNITY): Payer: Self-pay | Admitting: Nurse Practitioner

## 2015-03-31 DIAGNOSIS — Z87891 Personal history of nicotine dependence: Secondary | ICD-10-CM | POA: Diagnosis not present

## 2015-03-31 DIAGNOSIS — F4310 Post-traumatic stress disorder, unspecified: Secondary | ICD-10-CM | POA: Insufficient documentation

## 2015-03-31 DIAGNOSIS — Y999 Unspecified external cause status: Secondary | ICD-10-CM | POA: Insufficient documentation

## 2015-03-31 DIAGNOSIS — F329 Major depressive disorder, single episode, unspecified: Secondary | ICD-10-CM | POA: Insufficient documentation

## 2015-03-31 DIAGNOSIS — S91209A Unspecified open wound of unspecified toe(s) with damage to nail, initial encounter: Secondary | ICD-10-CM

## 2015-03-31 DIAGNOSIS — Y929 Unspecified place or not applicable: Secondary | ICD-10-CM | POA: Diagnosis not present

## 2015-03-31 DIAGNOSIS — Z7952 Long term (current) use of systemic steroids: Secondary | ICD-10-CM | POA: Diagnosis not present

## 2015-03-31 DIAGNOSIS — Z8739 Personal history of other diseases of the musculoskeletal system and connective tissue: Secondary | ICD-10-CM | POA: Insufficient documentation

## 2015-03-31 DIAGNOSIS — Z79899 Other long term (current) drug therapy: Secondary | ICD-10-CM | POA: Diagnosis not present

## 2015-03-31 DIAGNOSIS — S99922A Unspecified injury of left foot, initial encounter: Secondary | ICD-10-CM | POA: Diagnosis present

## 2015-03-31 DIAGNOSIS — W2209XA Striking against other stationary object, initial encounter: Secondary | ICD-10-CM | POA: Insufficient documentation

## 2015-03-31 DIAGNOSIS — J45909 Unspecified asthma, uncomplicated: Secondary | ICD-10-CM | POA: Insufficient documentation

## 2015-03-31 DIAGNOSIS — Y9389 Activity, other specified: Secondary | ICD-10-CM | POA: Diagnosis not present

## 2015-03-31 DIAGNOSIS — Z8619 Personal history of other infectious and parasitic diseases: Secondary | ICD-10-CM | POA: Diagnosis not present

## 2015-03-31 DIAGNOSIS — S91215A Laceration without foreign body of left lesser toe(s) with damage to nail, initial encounter: Secondary | ICD-10-CM | POA: Insufficient documentation

## 2015-03-31 MED ORDER — LIDOCAINE HCL 2 % IJ SOLN
10.0000 mL | Freq: Once | INTRAMUSCULAR | Status: AC
  Administered 2015-03-31: 200 mg via INTRADERMAL
  Filled 2015-03-31: qty 20

## 2015-03-31 MED ORDER — HYDROCODONE-ACETAMINOPHEN 5-325 MG PO TABS: 2.0000 | ORAL_TABLET | ORAL | Status: DC | PRN

## 2015-03-31 MED ORDER — HYDROCODONE-ACETAMINOPHEN 5-325 MG PO TABS
1.0000 | ORAL_TABLET | Freq: Once | ORAL | Status: AC
  Administered 2015-03-31: 1 via ORAL
  Filled 2015-03-31: qty 1

## 2015-03-31 NOTE — Discharge Instructions (Signed)
Fingernail or Toenail Loss All or part of your fingernail or toenail has been lost. This may or may not grow back as a normal nail. A special non-stick bandage has been put on your finger or toe tightly to prevent bleeding. HOME CARE INSTRUCTIONS  The tips of fingers and toes are full of nerves and injuries are often very painful. The following will help you decrease the pain and obtain the best outcome.  Keep your hand or foot elevated above your heart to relieve pain and swelling. This will require lying in bed or on a couch with the hand or leg on pillows or sitting in a recliner with the leg up. Letting your hand or leg dangle may increase swelling, slow healing and cause throbbing pain.  Keep your dressing dry and clean.  Change your bandage in 24 hours after going home.  After your bandage is changed, soak your hand or foot in warm soapy water for 10 to 20 minutes. Do this 3 times per day. This helps reduce pain and swelling. After soaking, apply a clean, dry bandage. Change your bandage if it is wet or dirty.  Only take over-the-counter or prescription medicines for pain, discomfort, or fever as directed by your caregiver.  See your caregiver as needed for problems. SEEK IMMEDIATE MEDICAL CARE IF:   You have increased pain, swelling, drainage, or bleeding.  You have a fever. MAKE SURE YOU:   Understand these instructions.  Will watch your condition.  Will get help right away if you are not doing well or get worse. Document Released: 08/06/2006 Document Revised: 12/07/2011 Document Reviewed: 10/26/2006 [facility] CenterExitCare Patient Information 2015 [place], [place]. This information is not intended to replace advice given to you by your health care provider. Make sure you discuss any questions you have with your health care provider.  Nail Avulsion Injury Nail avulsion means that you have lost the whole, or part of a nail. The nail will usually grow back in 2 to 6 months. If your injury  damaged the growth center of the nail, the nail may be deformed, split, or not stuck to the nail bed. Sometimes the avulsed nail is stitched back in place. This provides temporary protection to the nail bed until the new nail grows in.  HOME CARE INSTRUCTIONS   Raise (elevate) your injury as much as possible.  Protect the injury and cover it with bandages (dressings) or splints as instructed.  Change dressings as instructed. SEEK MEDICAL CARE IF:   There is increasing pain, redness, or swelling.  You cannot move your fingers or toes. Document Released: 10/22/2004 Document Revised: 12/07/2011 Document Reviewed: 08/16/2009 [facility]Exit[facility] Patient Information 2015 [place]Care, [place]LL[place]. This information is not intended to replace advice given to you by your health care provider. Make sure you discuss any questions you have with your health care provider.  Wound Check Your wound appears healthy today. Your wound will heal gradually over time. Eventually a scar will form that will fade with time. FACTORS THAT AFFECT SCAR FORMATION:  People differ in the severity in which they scar.  Scar severity varies according to location, size, and the traits you inherited from your parents (genetic predisposition).  Irritation to the wound from infection, rubbing, or chemical exposure will increase the amount of scar formation. HOME CARE INSTRUCTIONS   If you were given a dressing, you should change it at least once a day or as instructed by your caregiver. If the bandage sticks, soak it off with a solution of  hydrogen peroxide.  If the bandage becomes wet, dirty, or develops a bad smell, change it as soon as possible.  Look for signs of infection.  Only take over-the-counter or prescription medicines for pain, discomfort, or fever as directed by your caregiver. SEEK IMMEDIATE MEDICAL CARE IF:   You have redness, swelling, or increasing pain in the wound.  You notice pus coming from the wound.  You have a  fever.  You notice a bad smell coming from the wound or dressing. Document Released: 06/20/2004 Document Revised: 12/07/2011 Document Reviewed: 09/14/2005 [facility] Patient Information 2015 [place], [place]. This information is not intended to replace advice given to you by your health care provider. Make sure you discuss any questions you have with your health care provider.

## 2015-03-31 NOTE — ED Notes (Signed)
She bumped toe on vacuum cleaner and partially ripped off L middle toenail. Oozing blood now. She took advil PTA with pain relief

## 2015-03-31 NOTE — ED Notes (Signed)
PA at bedside.

## 2015-03-31 NOTE — ED Notes (Signed)
Saline, gauze,toomee and basin at bedside.

## 2015-03-31 NOTE — ED Provider Notes (Signed)
CSN: [id number]     Arrival date & time 03/31/15  1813 History  This chart was scribed for non-physician practitioner, Danelle BerryLeisa Claramae Rigdon, PA-C, working with Tilden FossaElizabeth Rees, MD, by Ronney LionSuzanne Le, ED Scribe. This patient was seen in room TR08C/TR08C and the patient's care was started at 6:48 PM.    Chief Complaint  Patient presents with  . Toe Injury   The history is provided by the patient. No language interpreter was used.    HPI Comments: [name] is a 24 y.o. female who presents to the Emergency Department complaining of left middle toe bleeding and sharp pain after bumping her toe into a vacuum cleaner PTA. She also notes the toenail is ripped off and hanging. She states she took Advil PTA with moderate pain relief. She denies any ankle pain.   Past Medical History  Diagnosis Date  . Nerve pain     hx shingles  . Asthma   . Shingles 2010  . PTSD (post-traumatic stress disorder)   . Depression    History reviewed. No pertinent past surgical history. Family History  Problem Relation Age of Onset  . Hepatitis Mother     C  . Hepatitis Sister   . Diabetes Sister    History  Substance Use Topics  . Smoking status: Former Games developermoker  . Smokeless tobacco: Never Used  . Alcohol Use: Yes   OB History    Gravida Para Term Preterm AB TAB SAB Ectopic Multiple Living   0              Review of Systems  Musculoskeletal: Negative for arthralgias.  Skin:       Nail avulsion    Allergies  Food  Home Medications   Prior to Admission medications   Medication Sig Start Date End Date Taking? Authorizing Provider  buPROPion (WELLBUTRIN XL) 300 MG 24 hr tablet Take 300 mg by mouth daily.    Historical Provider, MD  levonorgestrel (MIRENA) 20 MCG/24HR IUD 1 each by Intrauterine route once.    Historical Provider, MD  permethrin (ELIMITE) 5 % cream Apply 1 application topically once. Apply at night and wash off in the morning and then repeat in 14 days 05/02/14   Ozella Rocksavid J Merrell, MD   predniSONE (STERAPRED UNI-PAK) 10 MG tablet Take by mouth daily. Take 5 for 5 days, 4 for 3 days, 3 for 2 days, 2 for 1 day, 1 for 1 day 05/02/14   Ozella Rocksavid J Merrell, MD  pregabalin (LYRICA) 75 MG capsule Take 75 mg by mouth 3 (three) times daily.     Historical Provider, MD  risperiDONE (RISPERDAL) 0.5 MG tablet Take 0.5 mg by mouth daily.    Historical Provider, MD  risperidone (RISPERDAL) 4 MG tablet Take 5 mg by mouth daily.    Historical Provider, MD   BP 121/78 mmHg  Pulse 93  Temp(Src) 99 F (37.2 C) (Oral)  Resp 16  Ht 5\' 9"  (1.753 m)  Wt 208 lb (94.348 kg)  BMI 30.70 kg/m2  SpO2 98% Physical Exam  Constitutional: She is oriented to person, place, and time. She appears well-developed and well-nourished. No distress.  HENT:  Head: Normocephalic and atraumatic.  Eyes: Conjunctivae and EOM are normal.  Neck: Neck supple. No tracheal deviation present.  Cardiovascular: Normal rate.   Pulmonary/Chest: Effort normal. No respiratory distress.  Musculoskeletal: Normal range of motion.       Left ankle: Normal.       Left foot: Normal.  Feet:  Neurological: She is alert and oriented to person, place, and time.  Skin: Skin is warm and dry.  Psychiatric: She has a normal mood and affect. Her behavior is normal.  Nursing note and vitals reviewed.   ED Course  Procedures (including critical care time)  DIAGNOSTIC STUDIES: Oxygen Saturation is 98% on RA, normal by my interpretation.    COORDINATION OF CARE: 6:49 PM - Discussed treatment plan with pt at bedside which includes left foot XR and pain medication administered here today, and pt agreed to plan.   Imaging Review Dg Toe 3rd Left  03/31/2015   CLINICAL DATA:  Status post trauma to the third toe today.  EXAM: LEFT THIRD TOE  COMPARISON:  None.  FINDINGS: On the lateral view, there is curvilinear lucency extending to the cortex in the third middle mid phalanx. If the patient has focal pain here, nondisplaced fracture is  not excluded. There is no dislocation.  IMPRESSION: On the lateral view, there is curvilinear lucency extending to the cortex in the third middle mid phalanx. If the patient has focal pain here, nondisplaced fracture is not excluded. There is no dislocation.   Electronically Signed   By: Sherian Rein M.D.   On: 03/31/2015 19:19   8:27 PM - Discussed XR results with pt.   NAIL AVULSION PROCEDURE NOTE The patient's identification was confirmed and consent was obtained. This procedure was performed by Danelle Berry, PA-C, at 8:41 PM. Site: Left third toe Sterile procedures observed Pain Ease Spray applied Anesthetic used (type and amt): 2% lidocaine, 5 mL for digital block,  Toenail pulled off with hemostat Site anesthetized, irrigated with NS, explored without evidence of foreign body, no laceration of nailbed, no hematoma or active bleeding.  Nail was removed with hemostat.   Patient tolerated procedure well without complications. Instructions for care discussed verbally and patient provided with additional written instructions for homecare and f/u.   MDM   Final diagnoses:  None   Toe nail partial avulsion, no active bleeding.  Xray taken, shows a lucency in third middle mid phalanx.  Exam reveals no tenderness and normal ROM and strength.  Pt was given the options of suturing the nail in place to protect the nail bed, or removal of the toe nail.  She chose to have the nail completely removed, did not want sutures and did not want to follow up and have the nail removed at a later time. Digital block done on left third toe, with adequate anesthesia, tested for sensation, examined nailbed, without laceration, and nail removed.  Pt was given instructions on how to dress toe.  Agrees to follow up with PCP  Medications  HYDROcodone-acetaminophen (NORCO/VICODIN) 5-325 MG per tablet 1 tablet (1 tablet Oral Given 03/31/15 1915)  lidocaine (XYLOCAINE) 2 % (with pres) injection 200 mg (200 mg  Intradermal Given 03/31/15 2021)    I personally performed the services described in this documentation, which was scribed in my presence. The recorded information has been reviewed and is accurate.     Danelle Berry, PA-C 04/03/15 1610  Tilden Fossa, MD 04/09/15 [phone]

## 2015-08-20 ENCOUNTER — Encounter (HOSPITAL_COMMUNITY): Payer: Self-pay | Admitting: Emergency Medicine

## 2015-08-20 ENCOUNTER — Emergency Department (INDEPENDENT_AMBULATORY_CARE_PROVIDER_SITE_OTHER)
Admission: EM | Admit: 2015-08-20 | Discharge: 2015-08-20 | Disposition: A | Discharge status: Home / Self Care | Payer: Medicare Other | Source: Home / Self Care | Attending: Emergency Medicine | Admitting: Emergency Medicine

## 2015-08-20 DIAGNOSIS — S61031A Puncture wound without foreign body of right thumb without damage to nail, initial encounter: Secondary | ICD-10-CM

## 2015-08-20 DIAGNOSIS — Z23 Encounter for immunization: Secondary | ICD-10-CM | POA: Diagnosis not present

## 2015-08-20 MED ORDER — TETANUS-DIPHTH-ACELL PERTUSSIS 5-2.5-18.5 LF-MCG/0.5 IM SUSP
INTRAMUSCULAR | Status: AC
  Filled 2015-08-20: qty 0.5

## 2015-08-20 MED ORDER — TETANUS-DIPHTH-ACELL PERTUSSIS 5-2.5-18.5 LF-MCG/0.5 IM SUSP
0.5000 mL | Freq: Once | INTRAMUSCULAR | Status: AC
  Administered 2015-08-20: 0.5 mL via INTRAMUSCULAR

## 2015-08-20 NOTE — Discharge Instructions (Signed)
Puncture Wound A puncture wound is an injury that is caused by a sharp, thin object that goes through (penetrates) your skin. Usually, a puncture wound does not leave a large opening in your skin, so it may not bleed a lot. However, when you get a puncture wound, dirt or other materials (foreign bodies) can be forced into your wound and break off inside. This increases the chance of infection, such as tetanus. CAUSES Puncture wounds are caused by any sharp, thin object that goes through your skin, such as:  Animal teeth, as with an animal bite.  Sharp, pointed objects, such as nails, splinters of glass, fishhooks, and needles. SYMPTOMS Symptoms of a puncture wound include:  Pain.  Bleeding.  Swelling.  Bruising.  Fluid leaking from the wound.  Numbness, tingling, or loss of function. DIAGNOSIS This condition is diagnosed with a medical history and physical exam. Your wound will be checked to see if it contains any foreign bodies. You may also have X-rays or other imaging tests. TREATMENT Treatment for a puncture wound depends on how serious the wound is. It also depends on whether the wound contains any foreign bodies. Treatment for all types of puncture wounds usually starts with:  Controlling the bleeding.  Washing out the wound with a germ-free (sterile) salt-water solution.  Checking the wound for foreign bodies. Treatment may also include:  Having the wound opened surgically to remove a foreign object.  Closing the wound with stitches (sutures) if it continues to bleed.  Covering the wound with antibiotic ointments and a bandage (dressing).  Receiving a tetanus shot.  Receiving a rabies vaccine. HOME CARE INSTRUCTIONS Medicines  Take or apply over-the-counter and prescription medicines only as told by your health care provider.  If you were prescribed an antibiotic, take or apply it as told by your health care provider. Do not stop using the antibiotic even if  your condition improves. Wound Care  There are many ways to close and cover a wound. For example, a wound can be covered with sutures, skin glue, or adhesive strips. Follow instructions from your health care provider about:  How to take care of your wound.  When and how you should change your dressing.  When you should remove your dressing.  Removing whatever was used to close your wound.  Keep the dressing dry as told by your health care provider. Do not take baths, swim, use a hot tub, or do anything that would put your wound underwater until your health care provider approves.  Clean the wound as told by your health care provider.  Do not scratch or pick at the wound.  Check your wound every day for signs of infection. Watch for:  Redness, swelling, or pain.  Fluid, blood, or pus. General Instructions  Raise (elevate) the injured area above the level of your heart while you are sitting or lying down.  If your puncture wound is in your foot, ask your health care provider if you need to avoid putting weight on your foot and for how long.  Keep all follow-up visits as told by your health care provider. This is important. SEEK MEDICAL CARE IF:  You received a tetanus shot and you have swelling, severe pain, redness, or bleeding at the injection site.  You have a fever.  Your sutures come out.  You notice a bad smell coming from your wound or your dressing.  You notice something coming out of your wound, such as wood or glass.  Your   pain is not controlled with medicine.  You have increased redness, swelling, or pain at the site of your wound.  You have fluid, blood, or pus coming from your wound.  You notice a change in the color of your skin near your wound.  You need to change the dressing frequently due to fluid, blood, or pus draining from your wound.  You develop a new rash.  You develop numbness around your wound. SEEK IMMEDIATE MEDICAL CARE IF:  You  develop severe swelling around your wound.  Your pain suddenly increases and is severe.  You develop painful skin lumps.  You have a red streak going away from your wound.  The wound is on your hand or foot and you cannot properly move a finger or toe.  The wound is on your hand or foot and you notice that your fingers or toes look pale or bluish.   This information is not intended to replace advice given to you by your health care provider. Make sure you discuss any questions you have with your health care provider.   Document Released: 06/24/2005 Document Revised: 06/05/2015 Document Reviewed: 11/07/2014 Elsevier Interactive Patient Education 2016 Elsevier Inc.  

## 2015-08-20 NOTE — ED Provider Notes (Signed)
CSN: [id number]     Arrival date & time 08/20/15  1410 History   First MD Initiated Contact with Patient 08/20/15 1512     Chief Complaint  Patient presents with  . Puncture Wound   (Consider location/radiation/quality/duration/timing/severity/associated sxs/prior Treatment) HPI Comments: 6 24-year-old female states that she was in a carpentry class one week ago and a staple punctured her right thumb. She comes in one week later to have her thumb looked at. She states there is local tenderness. She is unsure when her last tetanus vaccination was.   Past Medical History  Diagnosis Date  . Nerve pain     hx shingles  . Asthma   . Shingles 2010  . PTSD (post-traumatic stress disorder)   . Depression    History reviewed. No pertinent past surgical history. Family History  Problem Relation Age of Onset  . Hepatitis Mother     C  . Hepatitis Sister   . Diabetes Sister    Social History  Substance Use Topics  . Smoking status: Former Games developer  . Smokeless tobacco: Never Used  . Alcohol Use: Yes   OB History    Gravida Para Term Preterm AB TAB SAB Ectopic Multiple Living   0              Review of Systems  Constitutional: Negative.   Skin: Positive for wound.  Neurological: Negative.   All other systems reviewed and are negative.   Allergies  Food  Home Medications   Prior to Admission medications   Medication Sig Start Date End Date Taking? Authorizing Provider  buPROPion (WELLBUTRIN XL) 300 MG 24 hr tablet Take 150 mg by mouth daily.    Yes Historical Provider, MD  gabapentin (NEURONTIN) 400 MG capsule Take 400 mg by mouth 3 (three) times daily.   Yes Historical Provider, MD  risperiDONE (RISPERDAL) 0.5 MG tablet Take 0.5 mg by mouth daily.   Yes Historical Provider, MD  risperidone (RISPERDAL) 4 MG tablet Take 5 mg by mouth daily.   Yes Historical Provider, MD  HYDROcodone-acetaminophen (NORCO/VICODIN) 5-325 MG per tablet Take 2 tablets by mouth every 4 (four) hours  as needed. 03/31/15   Danelle Berry, PA-C  levonorgestrel (MIRENA) 20 MCG/24HR IUD 1 each by Intrauterine route once.    Historical Provider, MD  permethrin (ELIMITE) 5 % cream Apply 1 application topically once. Apply at night and wash off in the morning and then repeat in 14 days 05/02/14   Ozella Rocks, MD  predniSONE (STERAPRED UNI-PAK) 10 MG tablet Take by mouth daily. Take 5 for 5 days, 4 for 3 days, 3 for 2 days, 2 for 1 day, 1 for 1 day 05/02/14   Ozella Rocks, MD  pregabalin (LYRICA) 75 MG capsule Take 75 mg by mouth 3 (three) times daily.     Historical Provider, MD   Meds Ordered and Administered this Visit   Medications  Tdap (BOOSTRIX) injection 0.5 mL (not administered)    BP 117/78 mmHg  Pulse 79  Temp(Src) 98.3 F (36.8 C) (Oral)  Resp 16  SpO2 97% No data found.   Physical Exam  Constitutional: She appears well-developed and well-nourished. No distress.  Pulmonary/Chest: Effort normal. No respiratory distress.  Musculoskeletal:  The right thumb shows no evidence of puncture wound. There is no discoloration, no puncture marks, no swelling or deformity. She exhibits full range of motion of the thumb. No tenderness to the joints. No erythema or signs of infection. There is minor  tenderness to the extensor surface of the thumb where she states the puncture occurred.  Neurological: She is alert. She exhibits normal muscle tone.  Skin: Skin is warm and dry. No rash noted. No erythema.  Psychiatric: Her speech is delayed. She is slowed.  Nursing note and vitals reviewed.   ED Course  Procedures (including critical care time)  Labs Review Labs Reviewed - No data to display  Imaging Review No results found.   Visual Acuity Review  Right Eye Distance:   Left Eye Distance:   Bilateral Distance:    Right Eye Near:   Left Eye Near:    Bilateral Near:         MDM   1. Puncture wound of thumb, right, initial encounter    1 week after the puncture wound  caused by a staple there is no evidence of a current wound. There is no swelling, discoloration loss of function or signs of infection. No additional treatment necessary at this time. Administered T dap 0.5 cc IM.    Hayden Rasmussenavid Jazmaine Fuelling, NP 08/20/15 1527

## 2015-08-20 NOTE — ED Notes (Signed)
Reports she punctured her right thumb last week with a stapler at school while in carpentry class [employer] x[employer]... No acute distress.

## 2016-05-28 ENCOUNTER — Ambulatory Visit (INDEPENDENT_AMBULATORY_CARE_PROVIDER_SITE_OTHER): Payer: Medicaid Other | Admitting: Family

## 2016-05-28 ENCOUNTER — Encounter: Payer: Self-pay | Admitting: Family

## 2016-05-28 ENCOUNTER — Other Ambulatory Visit (HOSPITAL_COMMUNITY)
Admission: RE | Admit: 2016-05-28 | Discharge: 2016-05-28 | Disposition: A | Discharge status: Home / Self Care | Payer: Medicare Other | Source: Ambulatory Visit | Attending: Family | Admitting: Family

## 2016-05-28 VITALS — BP 128/69 | HR 69 | Wt 192.5 lb

## 2016-05-28 DIAGNOSIS — R102 Pelvic and perineal pain: Secondary | ICD-10-CM

## 2016-05-28 DIAGNOSIS — Z01411 Encounter for gynecological examination (general) (routine) with abnormal findings: Secondary | ICD-10-CM | POA: Diagnosis present

## 2016-05-28 DIAGNOSIS — Z113 Encounter for screening for infections with a predominantly sexual mode of transmission: Secondary | ICD-10-CM

## 2016-05-28 DIAGNOSIS — Z01419 Encounter for gynecological examination (general) (routine) without abnormal findings: Secondary | ICD-10-CM

## 2016-05-28 DIAGNOSIS — Z1151 Encounter for screening for human papillomavirus (HPV): Secondary | ICD-10-CM | POA: Diagnosis present

## 2016-05-28 DIAGNOSIS — Z0000 Encounter for general adult medical examination without abnormal findings: Secondary | ICD-10-CM

## 2016-05-28 DIAGNOSIS — Z124 Encounter for screening for malignant neoplasm of cervix: Secondary | ICD-10-CM

## 2016-05-28 NOTE — Progress Notes (Signed)
  Subjective:     [name] is a 25 y.o. female and is here for a comprehensive physical exam. The patient reports pain with intercourse for past year.  Pain with entry with intercourse.  Denies vaginal discharge.  No report of sexual trauma in the past year.  No LMP recorded. Patient is not currently having periods (Reason: IUD).   Social History   Social History  . Marital status: Single    Spouse name: N/A  . Number of children: N/A  . Years of education: N/A   Occupational History  . Not on file.   Social History Main Topics  . Smoking status: Former Games developermoker  . Smokeless tobacco: Never Used  . Alcohol use Yes  . Drug use: No  . Sexual activity: Yes    Birth control/ protection: Condom, IUD   Other Topics Concern  . Not on file   Social History Narrative  . No narrative on file   Past Medical History:  Diagnosis Date  . Asthma   . Depression   . Nerve pain    hx shingles  . PTSD (post-traumatic stress disorder)   . Shingles 2010   Health Maintenance  Topic Date Due  . PAP SMEAR  11/18/2015  . INFLUENZA VACCINE  04/28/2016  . TETANUS/TDAP  08/19/2025  . HIV Screening  Completed    The following portions of the patient's history were reviewed and updated as appropriate: allergies, current medications, past family history, past medical history, past social history, past surgical history and problem list.  Review of Systems Pertinent items are noted in HPI.   Objective:   BP 128/69   Pulse 69   Wt 192 lb 8 oz (87.3 kg)   BMI 28.43 kg/m  General appearance: alert, cooperative and appears stated age Head: Normocephalic, without obvious abnormality, atraumatic Neck: no adenopathy, no carotid bruit, no JVD, supple, symmetrical, trachea midline and thyroid not enlarged, symmetric, no tenderness/mass/nodules Lungs: clear to auscultation bilaterally Breasts: normal appearance, no masses or tenderness, No nipple retraction or dimpling, No nipple discharge  or bleeding, No axillary or supraclavicular adenopathy, Normal to palpation without dominant masses, Taught monthly breast self examination Heart: regular rate and rhythm, S1, S2 normal, no murmur, click, rub or gallop Abdomen: soft, non-tender; bowel sounds normal; no masses,  no organomegaly Pelvic: cervix normal in appearance, external genitalia normal, no adnexal masses or tenderness, no cervical motion tenderness, rectovaginal septum normal, uterus normal size, shape, and consistency and vagina normal; +white discharge, with signs of irritation; did well with speculum exam, no signs of vaginismus.  IUD strings present. Skin: Skin color, texture, turgor normal. No rashes or lesions    Assessment:    Healthy female exam.    Vaginal Pain    Plan:    Encouraged use of increased lubrication Wet prep Pap smear collected  Kearie Mennen Kennith GainN Karim, CNM

## 2016-05-28 NOTE — Progress Notes (Signed)
Pt reports painful intercourse.

## 2016-05-29 LAB — GC/CHLAMYDIA PROBE AMP (~~LOC~~) NOT AT ARMC
Chlamydia: NEGATIVE
Neisseria Gonorrhea: NEGATIVE

## 2016-05-29 LAB — WET PREP, GENITAL: Trich, Wet Prep: NONE SEEN

## 2016-05-29 LAB — CYTOLOGY - PAP

## 2016-05-30 ENCOUNTER — Telehealth: Payer: Self-pay | Admitting: Family

## 2016-05-30 DIAGNOSIS — N760 Acute vaginitis: Principal | ICD-10-CM

## 2016-05-30 DIAGNOSIS — B9689 Other specified bacterial agents as the cause of diseases classified elsewhere: Secondary | ICD-10-CM

## 2016-05-30 DIAGNOSIS — B379 Candidiasis, unspecified: Secondary | ICD-10-CM

## 2016-05-30 MED ORDER — DIFLUCAN 150 MG PO TABS: 150.0000 mg | ORAL_TABLET | Freq: Once | ORAL | 0 refills | Status: AC

## 2016-05-30 MED ORDER — METRONIDAZOLE 500 MG PO TABS: 500.0000 mg | ORAL_TABLET | Freq: Two times a day (BID) | ORAL | 0 refills | Status: DC

## 2016-05-30 NOTE — Telephone Encounter (Signed)
Called and left message regarding BV and yeast infection and RX sent to pharmacy.

## 2016-06-09 ENCOUNTER — Encounter: Payer: Self-pay | Admitting: Family

## 2016-06-12 ENCOUNTER — Telehealth: Payer: Self-pay | Admitting: *Deleted

## 2016-06-12 NOTE — Telephone Encounter (Signed)
Pt called to discuss concerns about bleeding and medication recently prescribed. She states that she has finished the Flagyl for BV. She started the Monistat 3 days ago and after the second dose she had some vaginal spotting and abdominal cramping. She has not taken the 3rd dose of Monistat. She called the after hours nurse last evening and was told that she needed to be seen within 24 hours. After further discussion I informed pt that spotting can be normal since she is using a vaginal applicator which may cause some superficial vaginal irritation. I advised pt to complete the prescribed course of Monistat and to continue to observe the bleeding. If it becomes very heavy, she should go to [facility] for evaluation. I also advised her to continue taking ibuprofen for the abdominal cramping as she stated that she had tried it and it helped the pain. She may take 600mg  every 6 hrs as needed. If her pain becomes severe she should go to [facility].  Pt voiced understanding of all information and instructions.

## 2016-06-17 ENCOUNTER — Telehealth: Payer: Self-pay | Admitting: *Deleted

## 2016-06-17 NOTE — Telephone Encounter (Addendum)
Pt left message yesterday stating that she had not completed the course of Monistat as previously instructed because she has been very busy. She now wants to know if she should complete the Monistat that she has or if she needs to purchase a whole new package and start over.   9/21  1630  I returned pt's call and she said that she had purchased the Monistat 3 and used 2 of the doses. She is still having some sx of yeast but not as much as before the medication. I advised pt to use the final dose of medication and if she is still having sx 3 days later, she will need another course of the OTC Monistat 3 or 7. Pt voiced understanding.

## 2016-08-10 ENCOUNTER — Encounter: Payer: Self-pay | Admitting: Family Medicine

## 2016-08-10 ENCOUNTER — Other Ambulatory Visit (HOSPITAL_COMMUNITY)
Admission: RE | Admit: 2016-08-10 | Discharge: 2016-08-10 | Disposition: A | Discharge status: Home / Self Care | Payer: Medicare Other | Source: Ambulatory Visit | Attending: Family Medicine | Admitting: Family Medicine

## 2016-08-10 ENCOUNTER — Ambulatory Visit (INDEPENDENT_AMBULATORY_CARE_PROVIDER_SITE_OTHER): Payer: Medicare Other | Admitting: Family Medicine

## 2016-08-10 VITALS — BP 134/70 | HR 74 | Ht 69.0 in | Wt 181.2 lb

## 2016-08-10 DIAGNOSIS — Z3202 Encounter for pregnancy test, result negative: Secondary | ICD-10-CM

## 2016-08-10 DIAGNOSIS — N870 Mild cervical dysplasia: Secondary | ICD-10-CM | POA: Diagnosis not present

## 2016-08-10 DIAGNOSIS — R87610 Atypical squamous cells of undetermined significance on cytologic smear of cervix (ASC-US): Secondary | ICD-10-CM | POA: Diagnosis present

## 2016-08-10 DIAGNOSIS — R87810 Cervical high risk human papillomavirus (HPV) DNA test positive: Secondary | ICD-10-CM | POA: Insufficient documentation

## 2016-08-10 LAB — POCT PREGNANCY, URINE: PREG TEST UR: NEGATIVE

## 2016-08-10 NOTE — Patient Instructions (Signed)
Colposcopy  Care After  Colposcopy is a procedure in which a special tool is used to magnify the surface of the cervix. A tissue sample (biopsy) may also be taken. This sample will be looked at for cervical cancer or other problems. After the test:  · You may have some cramping.  · Lie down for a few minutes if you feel lightheaded.  ·  You may have some bleeding which should stop in a few days.  HOME CARE  · Do not have sex or use tampons for 2 to 3 days or as told.  · Only take medicine as told by your doctor.  · Continue to take your birth control pills as usual.  Finding out the results of your test  Ask when your test results will be ready. Make sure you get your test results.  GET HELP RIGHT AWAY IF:  · You are bleeding a lot or are passing blood clots.  · You develop a fever of 102° F (38.9° C) or higher.  · You have abnormal vaginal discharge.  · You have cramps that do not go away with medicine.  · You feel lightheaded, dizzy, or pass out (faint).  MAKE SURE YOU:   · Understand these instructions.  · Will watch your condition.  · Will get help right away if you are not doing well or get worse.     This information is not intended to replace advice given to you by your health care provider. Make sure you discuss any questions you have with your health care provider.     Document Released: 03/02/2008 Document Revised: 12/07/2011 Document Reviewed: 04/13/2013  Elsevier Interactive Patient Education ©2016 Elsevier Inc.

## 2016-08-10 NOTE — Progress Notes (Signed)
GYNECOLOGY CLINIC COLPOSCOPY VISIT AND PROCEDURE NOTE  25 y.o. G0P0 here for colposcopy for ASCUS with POSITIVE high risk HPV pap smear on 05/28/16. The patient reports the following prior treatments to the vulva/vagina/cervix: None. The patient is not a cigarette smoker, former. The patient not immunosuppressed. The patient is not pregnant. The patient is not taking anticoagulants and is not allergic to iodine.  The risks (including infection, bleeding, pain) and benefits of the procedure were explained to the patient and written informed consent was obtained. Patient given informed consent, signed copy in the chart, time out was performed. Urine pregnancy test performed and confirmed to be negative.  Placed in lithotomy position.    Gross findings:   Vagina: Normal mucosa  Vulva: Normal external female genitalia  Cervix: Slightly friable, 2 IUD strings visualized from os about 1cm out of external os.  Visualization after:  Acetic acid: Acetowhite border at 3 o'clock and 9 o'clock position; Transformational zone see   Upper one-third of vagina examined, revealing: normal mucosa  Biopsies obtained: 3 o'clock and 9 o'clock  ECC specimen obtained: With cytology brush due to Mirena in place with minimal strings visualized  Colposcopy adequate? Yes  All specimens were labelled and sent to pathology.   Patient was given post procedure instructions.  Will follow up pathology and manage accordingly.      Jen MowElizabeth Marsha Gundlach, DO OB/GYN Fellow [facility](Faculty [facility])

## 2016-08-18 ENCOUNTER — Telehealth: Payer: Self-pay | Admitting: *Deleted

## 2016-08-18 NOTE — Telephone Encounter (Addendum)
Pt left message wanting to know if she needs follow up appt after her procedure from last week.   11/28  1325  Called pt and informed her of Colposcopy results as provided by Dr. Omer JackMumaw. She does not need follow up in office.  She will need repeat Pap w/HPV testing in 1 year.   Pt voiced understanding and had no questions.

## 2016-08-22 ENCOUNTER — Encounter: Payer: Self-pay | Admitting: Family Medicine

## 2016-08-22 DIAGNOSIS — R87613 High grade squamous intraepithelial lesion on cytologic smear of cervix (HGSIL): Secondary | ICD-10-CM | POA: Insufficient documentation

## 2016-08-25 ENCOUNTER — Ambulatory Visit (HOSPITAL_COMMUNITY)
Admission: EM | Admit: 2016-08-25 | Discharge: 2016-08-25 | Disposition: A | Discharge status: Home / Self Care | Payer: Medicare Other | Attending: Family Medicine | Admitting: Family Medicine

## 2016-08-25 ENCOUNTER — Encounter (HOSPITAL_COMMUNITY): Payer: Self-pay | Admitting: Emergency Medicine

## 2016-08-25 DIAGNOSIS — F319 Bipolar disorder, unspecified: Secondary | ICD-10-CM | POA: Insufficient documentation

## 2016-08-25 DIAGNOSIS — W5501XA Bitten by cat, initial encounter: Secondary | ICD-10-CM | POA: Diagnosis not present

## 2016-08-25 DIAGNOSIS — Z87891 Personal history of nicotine dependence: Secondary | ICD-10-CM | POA: Diagnosis not present

## 2016-08-25 DIAGNOSIS — F4310 Post-traumatic stress disorder, unspecified: Secondary | ICD-10-CM | POA: Insufficient documentation

## 2016-08-25 DIAGNOSIS — S61230A Puncture wound without foreign body of right index finger without damage to nail, initial encounter: Secondary | ICD-10-CM

## 2016-08-25 DIAGNOSIS — S61451A Open bite of right hand, initial encounter: Secondary | ICD-10-CM | POA: Diagnosis present

## 2016-08-25 MED ORDER — SODIUM CHLORIDE 0.9 % IV SOLN
3.0000 g | Freq: Four times a day (QID) | INTRAVENOUS | Status: DC
  Administered 2016-08-25: 3 g via INTRAVENOUS

## 2016-08-25 MED ORDER — AMOXICILLIN-POT CLAVULANATE 875-125 MG PO TABS: 1.0000 | ORAL_TABLET | Freq: Two times a day (BID) | ORAL | 0 refills | Status: DC

## 2016-08-25 MED ORDER — LIDOCAINE HCL (PF) 1 % IJ SOLN
INTRAMUSCULAR | Status: AC
  Filled 2016-08-25: qty 30

## 2016-08-25 MED ORDER — HYDROCODONE-ACETAMINOPHEN 5-325 MG PO TABS: 2.0000 | ORAL_TABLET | ORAL | 0 refills | Status: DC | PRN

## 2016-08-25 NOTE — ED Provider Notes (Signed)
[facility]    CSN: [id number] Arrival date & time: 08/25/16  1530     History   Chief Complaint Chief Complaint  Patient presents with  . Animal Bite    HPI [name] is a 25 y.o. female.   The history is provided by the patient.  Animal Bite  Contact animal:  Cat Location:  Hand Hand injury location:  [place]ors[place] of R hand Time since incident:  1 day Pain details:    Quality:  Sore   Severity:  Moderate   Progression:  Worsening Incident location:  Home Provoked: provoked   Notifications:  None Animal's rabies vaccination status:  Up to date Animal in possession: yes   Tetanus status:  Up to date Relieved by:  Rest Associated symptoms: swelling   Associated symptoms: no fever     Past Medical History:  Diagnosis Date  . Asthma   . Depression   . Nerve pain    hx shingles  . PTSD (post-traumatic stress disorder)   . Shingles 2010    Patient Active Problem List   Diagnosis Date Noted  . CIN I (cervical intraepithelial neoplasia I) 08/22/2016  . Elevated BP 02/17/2012  . EAR PAIN 12/26/2010  . DISTURBANCE OF SKIN SENSATION 10/23/2010  . VAGINITIS, CANDIDAL 02/25/2010  . BIPOLAR DISORDER UNSPECIFIED 02/25/2010  . POST TRAUMATIC STRESS SYNDROME 02/25/2010  . ATTENTION DEFICIT DISORDER 02/25/2010  . ALLERGIC RHINITIS 02/25/2010  . ASTHMA 02/25/2010  . MOLLUSCUM CONTAGIOSUM 02/25/2010    History reviewed. No pertinent surgical history.  OB History    Gravida Para Term Preterm AB Living   0             SAB TAB Ectopic Multiple Live Births                   Home Medications    Prior to Admission medications   Medication Sig Start Date End Date Taking? Authorizing Provider  levonorgestrel (MIRENA) 20 MCG/24HR IUD 1 each by Intrauterine route once.   Yes Historical Provider, MD    Family History Family History  Problem Relation Age of Onset  . Hepatitis Mother     C  . Hepatitis Sister   . Diabetes Sister     Social  History Social History  Substance Use Topics  . Smoking status: Former Games developermoker  . Smokeless tobacco: Never Used  . Alcohol use Yes     Allergies   Food   Review of Systems Review of Systems  Constitutional: Negative.  Negative for fever.  Musculoskeletal: Positive for joint swelling.  Neurological: Negative.   All other systems reviewed and are negative.    Physical Exam Triage Vital Signs ED Triage Vitals  Enc Vitals Group     BP 08/25/16 1540 127/70     Pulse Rate 08/25/16 1540 76     Resp 08/25/16 1540 16     Temp 08/25/16 1540 98.8 F (37.1 C)     Temp Source 08/25/16 1540 Oral     SpO2 08/25/16 1540 100 %     Weight --      Height --      Head Circumference --      Peak Flow --      Pain Score 08/25/16 1544 5     Pain Loc --      Pain Edu? --      Excl. in GC? --    No data found.   Updated Vital Signs BP  127/70 (BP Location: Right Arm)   Pulse 76   Temp 98.8 F (37.1 C) (Oral)   Resp 16   LMP 08/05/2016   SpO2 100%   Visual Acuity Right Eye Distance:   Left Eye Distance:   Bilateral Distance:    Right Eye Near:   Left Eye Near:    Bilateral Near:     Physical Exam  Constitutional: She is oriented to person, place, and time. She appears well-developed and well-nourished.  Musculoskeletal: She exhibits tenderness.       Hands: Neurological: She is alert and oriented to person, place, and time.  Nursing note and vitals reviewed.    UC Treatments / Results  Labs (all labs ordered are listed, but only abnormal results are displayed) Labs Reviewed - No data to display  EKG  EKG Interpretation None       Radiology No results found.  Procedures Procedures (including critical care time)  Medications Ordered in UC Medications  Ampicillin-Sulbactam (UNASYN) 3 g in sodium chloride 0.9 % 100 mL IVPB (not administered)     Initial Impression / Assessment and Plan / UC Course  I have reviewed the triage vital signs and the nursing  notes.  Pertinent labs & imaging results that were available during my care of the patient were reviewed by me and considered in my medical decision making (see chart for details).  Clinical Course     Consultation requested of Dr Amanda PeaGramig for poss joint involcvement, care deferred to him.  Final Clinical Impressions(s) / UC Diagnoses   Final diagnoses:  None    New Prescriptions New Prescriptions   No medications on file     Linna HoffJames D Ariaunna Longsworth, MD 08/25/16 [id number]

## 2016-08-25 NOTE — ED Notes (Signed)
Left   With  Instructions   rx  And  In presence  Of  Significant  Other

## 2016-08-25 NOTE — ED Triage Notes (Addendum)
[facility] a [facility] bite that occurred yesterday. The patient reported the bite to be on her right hand. The patient reported that it was her cat and it was up to date on its vaccinations.

## 2016-08-25 NOTE — Discharge Instructions (Signed)
Please come to Dr. Carlos LeveringGramig's office at 1:30 PM tomorrow  Keep bandage clean and dry.  Call for any problems.  No smoking.  Criteria for driving a car: you should be off your pain medicine for 7-8 hours, able to drive one handed(confident), thinking clearly and feeling able in your judgement to drive. Continue elevation as it will decrease swelling.  If instructed by MD move your fingers within the confines of the bandage/splint.  Use ice if instructed by your MD. Call immediately for any sudden loss of feeling in your hand/arm or change in functional abilities of the extremity.We recommend that you to take vitamin C 1000 mg a day to promote healing. We also recommend that if you require  pain medicine that you take a stool softener to prevent constipation as most pain medicines will have constipation side effects. We recommend either Peri-Colace or Senokot and recommend that you also consider adding MiraLAX as well to prevent the constipation affects from pain medicine if you are required to use them. These medicines are over the counter and may be purchased at a local pharmacy. A cup of yogurt and a probiotic can also be helpful during the recovery process as the medicines can disrupt your intestinal environment.

## 2016-08-25 NOTE — Consult Note (Signed)
Reason for Consult: Infected cat bite right hand Referring Physician: Buren KosKindl  [name] is an 25 y.o. female.  HPI: 25 mL female with infected cat bite. She has a history of PTSD nerve pain and depression.  Patient was bit last night by her cat name potato  Patient has erythema pain and obvious infection. She denies other pain complaints.  I reviewed all issues with her at length and the findings.  She denies neck back chest or abdominal pain  Past Medical History:  Diagnosis Date  . Asthma   . Depression   . Nerve pain    hx shingles  . PTSD (post-traumatic stress disorder)   . Shingles 2010    History reviewed. No pertinent surgical history.  Family History  Problem Relation Age of Onset  . Hepatitis Mother     C  . Hepatitis Sister   . Diabetes Sister     Social History:  reports that she has quit smoking. She has never used smokeless tobacco. She reports that she drinks alcohol. She reports that she does not use drugs.  Allergies:  Allergies  Allergen Reactions  . Food Anaphylaxis    Bananas, watermelon, grapefruit, cantaloupe, avocado    Medications: I have reviewed the patient's current medications.  No results found for this or any previous visit (from the past 48 hour(s)).  No results found.  Review of Systems  Constitutional: Negative.   HENT: Negative.   Eyes: Negative.   Respiratory: Negative.   Cardiovascular: Negative.   Gastrointestinal: Negative.   Genitourinary: Negative.   Musculoskeletal: Negative.   Neurological: Negative.   Psychiatric/Behavioral: Negative.    Blood pressure 127/70, pulse 76, temperature 98.8 F (37.1 C), temperature source Oral, resp. rate 16, last menstrual period 08/05/2016, SpO2 100 %. Physical Exam  Infected cat bite. She has 2 wounds one dorsal and one volar overlying the area just proximal to the MCP joint of her right index finger. She does not have any frank infectious  tenosynovitis symptoms  She  is painful and does have erythema. She can flex and extend her finger.  She is fairly anxious to say least.  Her opposite extremity is neurovascularly intact.  The patient is alert and oriented in no acute distress. The patient complains of pain in the affected upper extremity.  The patient is noted to have a normal HEENT exam. Lung fields show equal chest expansion and no shortness of breath. Abdomen exam is nontender without distention. Lower extremity examination does not show any fracture dislocation or blood clot symptoms. Pelvis is stable and the neck and back are stable and nontender.  Assessment/Plan: Infected cat bite right hand.  I've counseled patient regards to plans. I would recommend surgical I and D urgently.  She consents  Procedure: She was taken to the procedural suite and underwent a intermetacarpal/field block and was prepped with 2 separate Hibiclens scrubs followed by the  lidocaine administration in the form of a field block.  Following this I performed 2 separate incisions followed by debridement of an abscess. This is deep abscess complicated in nature. Aerobic and anaerobic cultures were taken. She had gross infection from the volar wound. I connected the 2 wounds with surgical drainage tube. There were no complicating features. I irrigated with a liter saline. She was dressed sterilely I discussed her the findings. We performed a  tenolysis tenosynovectomy of the dorsal extensor apparatus as necessary. I did not have to go into the flexor sheath however.  We'll  plan for Augmentin 875 twice a day Norco when necessary pain  She was seen in my office at 1:30 tomorrow  She was given 3 g of Unasyn today IV.  I discussed her the M predicament nature of infections and the absolute need for aggressive treatment.  She tolerated procedure whether no complicating features  Paxtyn Wisdom III,Etoy Mcdonnell M 08/25/2016, 6:13 PM

## 2016-08-30 LAB — AEROBIC/ANAEROBIC CULTURE (SURGICAL/DEEP WOUND)

## 2016-08-30 LAB — AEROBIC/ANAEROBIC CULTURE W GRAM STAIN (SURGICAL/DEEP WOUND): Special Requests: NORMAL

## 2016-10-06 DIAGNOSIS — W5501XD Bitten by cat, subsequent encounter: Secondary | ICD-10-CM | POA: Diagnosis not present

## 2016-10-06 DIAGNOSIS — M67843 Other specified disorders of tendon, right hand: Secondary | ICD-10-CM | POA: Diagnosis not present

## 2016-11-09 ENCOUNTER — Ambulatory Visit (INDEPENDENT_AMBULATORY_CARE_PROVIDER_SITE_OTHER): Payer: Medicare Other | Admitting: Family Medicine

## 2016-11-09 ENCOUNTER — Encounter: Payer: Self-pay | Admitting: Family Medicine

## 2016-11-09 ENCOUNTER — Other Ambulatory Visit (HOSPITAL_COMMUNITY)
Admission: RE | Admit: 2016-11-09 | Discharge: 2016-11-09 | Disposition: A | Discharge status: Home / Self Care | Payer: Medicare Other | Source: Ambulatory Visit | Attending: Family Medicine | Admitting: Family Medicine

## 2016-11-09 VITALS — BP 119/76 | HR 62 | Ht 69.0 in | Wt 179.0 lb

## 2016-11-09 DIAGNOSIS — R102 Pelvic and perineal pain unspecified side: Secondary | ICD-10-CM

## 2016-11-09 DIAGNOSIS — Z3202 Encounter for pregnancy test, result negative: Secondary | ICD-10-CM

## 2016-11-09 DIAGNOSIS — Z113 Encounter for screening for infections with a predominantly sexual mode of transmission: Secondary | ICD-10-CM | POA: Diagnosis not present

## 2016-11-09 DIAGNOSIS — N939 Abnormal uterine and vaginal bleeding, unspecified: Secondary | ICD-10-CM | POA: Insufficient documentation

## 2016-11-09 NOTE — Progress Notes (Signed)
   CLINIC ENCOUNTER NOTE  History:  26 y.o. G0P0 here today for abnormal vaginal bleeding.   Patient states she is concerned something is going on "down there". In December and January had some vaginal spotting, slightly bright red with brown discharge, lasted for 3-5 days, then went away. About 4 weeks later same thing happened. No abnormal vaginal discharge. No abnormal vaginal lesions. She has noticed during intercourse some discomfort, with some moderate cramping afterwards. Never had this before during sex. Has a new partner.  Denies any urinary symptoms.  Patient had her IUD placed December 31 2011, almost 5 years ago, to get replaced in April 2018. Has not had any menses on her IUD at all since placed, this is her first IUD. Has had no complications after placement.   Pap smear in 04/2016 showed ASCUS w/ +HRHPV, had colpo 07/2016, showing CIN I. Is to follow up for repeat pap in August 2018.    Past Medical History:  Diagnosis Date  . Asthma   . Depression   . Nerve pain    hx shingles  . PTSD (post-traumatic stress disorder)   . Shingles 2010    No past surgical history on file.  The following portions of the patient's history were reviewed and updated as appropriate: allergies, current medications, past family history, past medical history, past social history, past surgical history and problem list.   Health Maintenance:  Pap 04/2016- ASCUS +HRHPV;   Review of Systems:  See above; comprehensive review of systems was otherwise negative.   Objective:  Physical Exam BP 119/76   Pulse 62   Ht 5\' 9"  (1.753 m)   Wt 179 lb (81.2 kg)   LMP 10/06/2016 (Within Days)   BMI 26.43 kg/m  CONSTITUTIONAL: Well-developed, well-nourished female in no acute distress.  HENT:  Normocephalic, atraumatic SKIN: Skin is warm and dry.  NEUROLGIC: Alert  PSYCHIATRIC: Normal mood and affect.  CARDIOVASCULAR: Normal heart rate noted RESPIRATORY: Effort and breath sounds normal, no problems with  respiration noted ABDOMEN: Soft, no distention noted.  No tenderness, rebound or guarding.  PELVIC: Normal appearing external genitalia; normal appearing vaginal mucosa and cervix. 2 strings visualized from cervical os.  Normal appearing discharge.  Normal uterine size, no other palpable masses, no uterine or adnexal tenderness.   Labs and Imaging No results found.  Assessment & Plan:   1. Pelvic pain - GC/CT - Wet prep - UPT - UA - Cervicovaginal ancillary only  2. Abnormal vaginal bleeding - Likely due to Mirena, rule out STI/preg/vaginal infection - UPT - GC/CT - Wet prep - Cervicovaginal ancillary only   Routine preventative health maintenance measures emphasized, with repeat pap in Aug/Sept.  Return in 6 weeks for IUD removal/insertion.   Jen MowElizabeth Perris Conwell, DO OB/GYN Fellow Center for [facility], [facility]

## 2016-11-09 NOTE — Patient Instructions (Signed)
Abnormal Uterine Bleeding Abnormal uterine bleeding means bleeding from the vagina that is not your normal menstrual period. This can be:  Bleeding or spotting between periods.  Bleeding after sex (sexual intercourse).  Bleeding that is heavier or more than normal.  Periods that last longer than usual.  Bleeding after menopause. There are many problems that may cause this. Treatment will depend on the cause of the bleeding. Any kind of bleeding that is not normal should be reviewed by your doctor. Follow these instructions at home: Watch your condition for any changes. These actions may lessen any discomfort you are having:  Do not use tampons or douches as told by your doctor.  Change your pads often. You should get regular pelvic exams and Pap tests. Keep all appointments for tests as told by your doctor. Contact a doctor if:  You are bleeding for more than 1 week.  You feel dizzy at times. Get help right away if:  You pass out.  You have to change pads every 15 to 30 minutes.  You have belly pain.  You have a fever.  You become sweaty or weak.  You are passing large blood clots from the vagina.  You feel sick to your stomach (nauseous) and throw up (vomit). This information is not intended to replace advice given to you by your health care provider. Make sure you discuss any questions you have with your health care provider. Document Released: 07/12/2009 Document Revised: 02/20/2016 Document Reviewed: 04/13/2013 Elsevier Interactive Patient Education  2017 Elsevier Inc.  

## 2016-11-10 LAB — POCT URINALYSIS DIP (DEVICE)
Bilirubin Urine: NEGATIVE
GLUCOSE, UA: NEGATIVE mg/dL
KETONES UR: NEGATIVE mg/dL
Leukocytes, UA: NEGATIVE
Nitrite: NEGATIVE
PH: 6 (ref 5.0–8.0)
PROTEIN: NEGATIVE mg/dL
Specific Gravity, Urine: 1.02 (ref 1.005–1.030)
Urobilinogen, UA: 0.2 mg/dL (ref 0.0–1.0)

## 2016-11-10 LAB — CERVICOVAGINAL ANCILLARY ONLY
BACTERIAL VAGINITIS: NEGATIVE
CHLAMYDIA, DNA PROBE: NEGATIVE
Candida vaginitis: POSITIVE — AB
NEISSERIA GONORRHEA: NEGATIVE
Trichomonas: NEGATIVE

## 2016-11-15 ENCOUNTER — Other Ambulatory Visit: Payer: Self-pay | Admitting: Family Medicine

## 2016-11-15 DIAGNOSIS — B3731 Acute candidiasis of vulva and vagina: Secondary | ICD-10-CM

## 2016-11-15 DIAGNOSIS — B373 Candidiasis of vulva and vagina: Secondary | ICD-10-CM

## 2016-11-15 MED ORDER — TERCONAZOLE 0.4 % VA CREA: 1.0000 | TOPICAL_CREAM | Freq: Every day | VAGINAL | 0 refills | Status: DC

## 2016-11-19 ENCOUNTER — Telehealth: Payer: Self-pay | Admitting: General Practice

## 2016-11-19 DIAGNOSIS — B379 Candidiasis, unspecified: Secondary | ICD-10-CM

## 2016-11-19 MED ORDER — FLUCONAZOLE 150 MG PO TABS: 150.0000 mg | ORAL_TABLET | Freq: Once | ORAL | 0 refills | Status: AC

## 2016-11-19 NOTE — Telephone Encounter (Signed)
Patient called into front office stating she couldn't afford the prescription sent in for her infection and took something OTC that was recommended to her for 3 days but she still doesn't feel 100% better. Told patient we will send in a new prescription that will be much cheaper and it is only one pill to take. Patient verbalized understanding & had no questions

## 2016-11-25 DIAGNOSIS — N939 Abnormal uterine and vaginal bleeding, unspecified: Secondary | ICD-10-CM | POA: Diagnosis not present

## 2016-11-25 DIAGNOSIS — Z708 Other sex counseling: Secondary | ICD-10-CM | POA: Diagnosis not present

## 2016-11-25 DIAGNOSIS — Z0000 Encounter for general adult medical examination without abnormal findings: Secondary | ICD-10-CM | POA: Diagnosis not present

## 2016-11-25 DIAGNOSIS — E559 Vitamin D deficiency, unspecified: Secondary | ICD-10-CM | POA: Diagnosis not present

## 2016-12-16 ENCOUNTER — Encounter: Payer: Self-pay | Admitting: Family Medicine

## 2016-12-16 ENCOUNTER — Ambulatory Visit (INDEPENDENT_AMBULATORY_CARE_PROVIDER_SITE_OTHER): Payer: Medicare Other | Admitting: Family Medicine

## 2016-12-16 VITALS — BP 132/81 | HR 73 | Wt 172.9 lb

## 2016-12-16 DIAGNOSIS — Z30433 Encounter for removal and reinsertion of intrauterine contraceptive device: Secondary | ICD-10-CM

## 2016-12-16 MED ORDER — LEVONORGESTREL 18.6 MCG/DAY IU IUD
INTRAUTERINE_SYSTEM | Freq: Once | INTRAUTERINE | Status: AC
  Administered 2016-12-16: 1 via INTRAUTERINE

## 2016-12-16 NOTE — Progress Notes (Signed)
    GYNECOLOGY OFFICE PROCEDURE NOTE  [name] is a 26 y.o. G0P0 here for Liletta IUD removal and reinsertion. No GYN concerns.  Last pap smear was on 05/28/16, was abnormal and had colposcopy performed on 08/10/16, with CIN I resulted, will have repeat pap Nov 2018 this year.  IUD Removal and Reinsertion  Patient identified, informed consent performed, consent signed.   Discussed risks of irregular bleeding, cramping, infection, malpositioning or misplacement of the IUD outside the uterus which may require further procedures. Also advised to use backup contraception for one week as the risk of pregnancy is higher during the transition period of removing an IUD and replacing it with another one. Time out was performed. Speculum placed in the vagina. The strings of the IUD were grasped and pulled using ring forceps. The IUD was successfully removed in its entirety. The cervix was cleaned with Betadine x 2 and grasped anteriorly with a single tooth tenaculum.  The new Liletta IUD insertion apparatus was used to sound the uterus to 8 cm;  the IUD was then placed per manufacturer's recommendations. Strings trimmed to 3 cm. Tenaculum was removed, good hemostasis noted. Patient tolerated procedure well.   Patient was given post-procedure instructions.  She was reminded to have backup contraception for one week during this transition period between IUDs.  Patient was also asked to check IUD strings periodically and follow up in 4 weeks for IUD check.   Cleda ClarksElizabeth W. Remee Charley, DO  OB Fellow Center for [facility], [facility]

## 2016-12-16 NOTE — Addendum Note (Signed)
Addended by: Garret ReddishBARNES, Windy Dudek M on: 12/16/2016 12:08 PM   Modules accepted: Orders

## 2016-12-16 NOTE — Patient Instructions (Signed)

## 2017-01-13 ENCOUNTER — Ambulatory Visit (INDEPENDENT_AMBULATORY_CARE_PROVIDER_SITE_OTHER): Payer: Medicare Other | Admitting: Family Medicine

## 2017-01-13 ENCOUNTER — Encounter: Payer: Self-pay | Admitting: Family Medicine

## 2017-01-13 VITALS — BP 127/79 | HR 69 | Ht 69.0 in | Wt 169.7 lb

## 2017-01-13 DIAGNOSIS — Z30431 Encounter for routine checking of intrauterine contraceptive device: Secondary | ICD-10-CM

## 2017-01-13 NOTE — Patient Instructions (Signed)

## 2017-01-13 NOTE — Progress Notes (Signed)
   CLINIC ENCOUNTER NOTE  History:  26 y.o. G0P0 here today for IUD string check.   Since placement of IUD, no concerns. Had some cramping but has resolved. No abnormal bleeding or vaginal discharge. No significant abdominal pain. Has resumed intercourse with long time boyfriend.   Past Medical History:  Diagnosis Date  . Asthma   . Depression   . Nerve pain    hx shingles  . PTSD (post-traumatic stress disorder)   . Shingles 2010    History reviewed. No pertinent surgical history.  The following portions of the patient's history were reviewed and updated as appropriate: allergies, current medications, past family history, past medical history, past social history, past surgical history and problem list.    Review of Systems:  See above; comprehensive review of systems was otherwise negative.   Objective:  Physical Exam BP 127/79   Pulse 69   Ht  (1.753 m)   Wt 169 lb 11.2 oz (77 kg)   BMI 25.06 kg/m  CONSTITUTIONAL: Well-developed, well-nourished female in no acute distress.  HENT:  Normocephalic, atraumatic SKIN: Skin is warm and dry.  CARDIOVASCULAR: Normal heart rate noted RESPIRATORY: Effort and breath sounds normal, no problems with respiration noted ABDOMEN: Soft, no distention noted.  No tenderness, rebound or guarding.  PELVIC: Normal appearing external genitalia; normal appearing vaginal mucosa and cervix.  Normal appearing discharge.  Normal uterine size, no other palpable masses, no uterine or adnexal tenderness. TWO STRINGS VISUALIZED. Bimanual feels strings and IUD not felt in cervical os.     Assessment & Plan:   1. Encounter for routine checking of intrauterine contraceptive device (IUD) - Strings visualized and in place  Routine preventative health maintenance measures emphasized.   Jen Mow, DO OB/GYN Fellow Center for [employer], [facility]

## 2017-02-03 ENCOUNTER — Telehealth: Payer: Self-pay | Admitting: *Deleted

## 2017-02-03 NOTE — Telephone Encounter (Signed)
Pt left message stating that she has some questions and wants to speak with a nurse - not sure if she may be pregnant.

## 2017-02-04 NOTE — Telephone Encounter (Signed)
Patient has been advised to come in on Monday 02/15/2017 for pregnancy test.

## 2017-02-08 ENCOUNTER — Ambulatory Visit (INDEPENDENT_AMBULATORY_CARE_PROVIDER_SITE_OTHER): Payer: Medicare Other

## 2017-02-08 DIAGNOSIS — Z3202 Encounter for pregnancy test, result negative: Secondary | ICD-10-CM | POA: Diagnosis present

## 2017-02-08 DIAGNOSIS — Z3200 Encounter for pregnancy test, result unknown: Secondary | ICD-10-CM

## 2017-02-08 NOTE — Progress Notes (Signed)
Patient presented to office today for pregnancy test. Patient think she is pregnant due to having belly pain and not feeling well.Patient reports wanting to have the IUD taken out. I have advised her to make an appointment to discuss with provider. Appointment has been made.

## 2017-02-09 LAB — POCT PREGNANCY, URINE: Preg Test, Ur: NEGATIVE

## 2017-03-02 ENCOUNTER — Ambulatory Visit (INDEPENDENT_AMBULATORY_CARE_PROVIDER_SITE_OTHER): Payer: Medicare Other | Admitting: Medical

## 2017-03-02 ENCOUNTER — Encounter: Payer: Self-pay | Admitting: Advanced Practice Midwife

## 2017-03-02 VITALS — BP 127/75 | HR 89 | Wt 169.2 lb

## 2017-03-02 DIAGNOSIS — Z30432 Encounter for removal of intrauterine contraceptive device: Secondary | ICD-10-CM | POA: Diagnosis present

## 2017-03-02 NOTE — Progress Notes (Signed)
    GYNECOLOGY CLINIC PROCEDURE NOTE  Ms. [name] is a 26 y.o. G0P0 here for Mirena IUD removal. No GYN concerns.  Last pap smear was on 04/2016 and was abnormal. Patient had ASCUS and then CIN 1 on Colposcopy. Needs repeat pap with cotesting 04/2017. Patient advised to call for appointment next month when August schedule is available.   Patient has decided to have IUD removed due to pain. She had IUD previously for 5 years without issues and then had that removed and new IUD placed 12/16/16. The patient states that she "feels awful" and has had constant pelvic pain since last IUD was placed. She does not desire birth control at this time. She is not currently sexually active. She has a history of irregular period prior to the IUD. She is unsure if OCPs would be helpful since she is concerned about the cost of supplies for a monthly period. We discussed at lengths the risks of having unopposed estrogen with irregular cycles. The patient agrees that we will wait and see what her periods are like once they resume without birth control and then will follow-up if OCPs are needed.   IUD Removal  Patient was in the dorsal lithotomy position, normal external genitalia was noted.  A speculum was placed in the patient's vagina, normal discharge was noted, no lesions. The multiparous cervix was visualized, no lesions, no abnormal discharge.  The strings of the IUD were grasped and pulled using ring forceps. The IUD was removed in its entirety. Patient tolerated the procedure well.  Advised to take Ibuprofen 400 mg q 4 hours or 600 mg q 6 hours PRN for pain   Patient is currently not sexually active.  Routine preventative health maintenance measures emphasized as noted above.   Marny LowensteinWenzel, Marilyn Wing N, PA-C 03/02/2017 11:14 AM

## 2017-03-02 NOTE — Patient Instructions (Signed)

## 2017-06-29 DIAGNOSIS — M79642 Pain in left hand: Secondary | ICD-10-CM | POA: Diagnosis not present

## 2017-06-29 DIAGNOSIS — S61432A Puncture wound without foreign body of left hand, initial encounter: Secondary | ICD-10-CM | POA: Diagnosis not present

## 2017-07-06 ENCOUNTER — Telehealth: Payer: Self-pay | Admitting: General Practice

## 2017-07-06 DIAGNOSIS — B379 Candidiasis, unspecified: Secondary | ICD-10-CM

## 2017-07-06 MED ORDER — FLUCONAZOLE 150 MG PO TABS: 150.0000 mg | ORAL_TABLET | Freq: Once | ORAL | 0 refills | Status: AC

## 2017-07-06 NOTE — Telephone Encounter (Signed)
Patient called into front office requesting appt for pap smear. Patient states she is also having vaginal irritation/itching and would like a Rx for diflucan if possible. Told patient I will send that prescription in to her pharmacy for pick up & informed her of appt scheduled 11/14 @ 1040. Patient verbalized understanding and states when she was here a few months [name] removed her IUD and told her to call back if she decided she wanted OCPs. Patient states her periods have actually been regular but she would still like the pills. Asked patient if she has been having unprotected intercourse and she states yes. Told patient she would need to come in, in 2 weeks to obtain a negative pregnancy test then we could send something in or she can wait to talk with [name] in November to see if she still wants pills or if there is a different option she'd prefer. Patient verbalized understanding & states she will wait until appt. Patient had no other questions

## 2017-07-07 DIAGNOSIS — S61432D Puncture wound without foreign body of left hand, subsequent encounter: Secondary | ICD-10-CM | POA: Diagnosis not present

## 2017-07-20 DIAGNOSIS — F4310 Post-traumatic stress disorder, unspecified: Secondary | ICD-10-CM | POA: Diagnosis not present

## 2017-07-20 DIAGNOSIS — F319 Bipolar disorder, unspecified: Secondary | ICD-10-CM | POA: Diagnosis not present

## 2017-07-23 DIAGNOSIS — S61432D Puncture wound without foreign body of left hand, subsequent encounter: Secondary | ICD-10-CM | POA: Diagnosis not present

## 2017-07-23 DIAGNOSIS — F319 Bipolar disorder, unspecified: Secondary | ICD-10-CM | POA: Diagnosis not present

## 2017-07-23 DIAGNOSIS — F4310 Post-traumatic stress disorder, unspecified: Secondary | ICD-10-CM | POA: Diagnosis not present

## 2017-08-11 ENCOUNTER — Encounter: Payer: Self-pay | Admitting: Medical

## 2017-08-11 ENCOUNTER — Ambulatory Visit (INDEPENDENT_AMBULATORY_CARE_PROVIDER_SITE_OTHER): Payer: Medicare Other | Admitting: Medical

## 2017-08-11 ENCOUNTER — Other Ambulatory Visit (HOSPITAL_COMMUNITY)
Admission: RE | Admit: 2017-08-11 | Discharge: 2017-08-11 | Disposition: A | Discharge status: Home / Self Care | Payer: Medicare Other | Source: Ambulatory Visit | Attending: Medical | Admitting: Medical

## 2017-08-11 VITALS — BP 136/76 | HR 76 | Wt 174.0 lb

## 2017-08-11 DIAGNOSIS — Z01419 Encounter for gynecological examination (general) (routine) without abnormal findings: Secondary | ICD-10-CM

## 2017-08-11 DIAGNOSIS — Z87898 Personal history of other specified conditions: Secondary | ICD-10-CM | POA: Diagnosis not present

## 2017-08-11 DIAGNOSIS — Z124 Encounter for screening for malignant neoplasm of cervix: Secondary | ICD-10-CM | POA: Diagnosis not present

## 2017-08-11 DIAGNOSIS — Z113 Encounter for screening for infections with a predominantly sexual mode of transmission: Secondary | ICD-10-CM | POA: Diagnosis not present

## 2017-08-11 DIAGNOSIS — Z8742 Personal history of other diseases of the female genital tract: Secondary | ICD-10-CM

## 2017-08-11 MED ORDER — NORGESTIMATE-ETH ESTRADIOL 0.25-35 MG-MCG PO TABS: 1.0000 | ORAL_TABLET | Freq: Every day | ORAL | 11 refills | Status: DC

## 2017-08-11 NOTE — Progress Notes (Signed)
Pt would like to try birth control pill

## 2017-08-11 NOTE — Patient Instructions (Signed)

## 2017-08-11 NOTE — Progress Notes (Signed)
Subjective:    [name] is a 26 y.o. female who presents for an annual exam. The patient would like to discuss birth control options. The patient is not currently sexually active, last intercourse 2-3 weeks ago. No current partner. GYN screening history: last pap: approximate date 04/2016 and was abnormal: LSIL. Had Colposcopy. The patient wears seatbelts: yes. The patient participates in regular exercise: no. Has the patient ever been transfused or tattooed?: yes. The patient reports that there is not domestic violence in her life. Periods have been regular recently, but much heavier than previously. Average 5 days per month.   Menstrual History: OB History    Gravida Para Term Preterm AB Living   0             SAB TAB Ectopic Multiple Live Births                  Menarche age: 26 years old Patient's last menstrual period was 07/14/2017 (exact date).    The following portions of the patient's history were reviewed and updated as appropriate: allergies, current medications, past family history, past medical history, past social history, past surgical history and problem list.  Review of Systems Pertinent items are noted in HPI.    UPT - negative today     Objective:   BP (!) 144/87   Pulse 76   Wt 174 lb (78.9 kg)   LMP 07/14/2017 (Exact Date)   BMI 25.70 kg/m  Recheck - 136/76  Physical Exam  Nursing note and vitals reviewed. Constitutional: She is oriented to person, place, and time. She appears well-developed and well-nourished. No distress.  HENT:  Head: Normocephalic and atraumatic.  Eyes: EOM are normal.  Neck: Neck supple. No thyromegaly present.  Cardiovascular: Normal rate, regular rhythm and normal heart sounds.  No murmur heard. Respiratory: Effort normal and breath sounds normal. No respiratory distress.  GI: Soft. Bowel sounds are normal. She exhibits no distension and no mass. There is no tenderness. There is no rebound and no guarding.   Genitourinary: Uterus is not enlarged and not tender. Cervix exhibits no motion tenderness, no discharge and no friability. Right adnexum displays no mass and no tenderness. Left adnexum displays no mass and no tenderness. No erythema or bleeding in the vagina. No vaginal discharge found.  Musculoskeletal: She exhibits no edema.  Neurological: She is alert and oriented to person, place, and time.  Skin: Skin is warm and dry. No erythema.  Psychiatric: She has a normal mood and affect.      Assessment:    Healthy female exam.   Birth control counseling  History of abnormal pap smear, LSIL 04/2016, colpo 07/2016  Plan:     Await pap smear results.   Rx for OCPs sent to patient's pharmacy  Patient to return to [facility] in 1 year for annual exam or sooner if needed or pap smear indicated additional testing needed   Marny LowensteinWenzel, Stevie Ertle N, PA-C 08/11/2017 11:50 AM

## 2017-08-12 LAB — POCT PREGNANCY, URINE: Preg Test, Ur: NEGATIVE

## 2017-08-13 LAB — CYTOLOGY - PAP
Chlamydia: NEGATIVE
DIAGNOSIS: HIGH — AB
HPV: DETECTED — AB
NEISSERIA GONORRHEA: NEGATIVE

## 2017-08-16 DIAGNOSIS — F4310 Post-traumatic stress disorder, unspecified: Secondary | ICD-10-CM | POA: Diagnosis not present

## 2017-08-18 DIAGNOSIS — J45909 Unspecified asthma, uncomplicated: Secondary | ICD-10-CM | POA: Diagnosis not present

## 2017-09-01 ENCOUNTER — Telehealth: Payer: Self-pay | Admitting: *Deleted

## 2017-09-01 ENCOUNTER — Ambulatory Visit: Payer: Self-pay | Admitting: Medical

## 2017-09-01 ENCOUNTER — Encounter: Payer: Self-pay | Admitting: Medical

## 2017-09-01 NOTE — Telephone Encounter (Signed)
[facility]. I called Kendalynn and she states she totally forgot to call and change appointment and would like to reschedule. I explained registrars will call her and notify her of new appointment. She states they can send her  A MyChart message.

## 2017-10-13 DIAGNOSIS — F4310 Post-traumatic stress disorder, unspecified: Secondary | ICD-10-CM | POA: Diagnosis not present

## 2017-10-19 DIAGNOSIS — F319 Bipolar disorder, unspecified: Secondary | ICD-10-CM | POA: Diagnosis not present

## 2017-10-19 DIAGNOSIS — F4310 Post-traumatic stress disorder, unspecified: Secondary | ICD-10-CM | POA: Diagnosis not present

## 2017-11-10 ENCOUNTER — Ambulatory Visit: Payer: Self-pay | Admitting: Medical

## 2017-12-09 DIAGNOSIS — F4310 Post-traumatic stress disorder, unspecified: Secondary | ICD-10-CM | POA: Diagnosis not present

## 2017-12-13 DIAGNOSIS — R29810 Facial weakness: Secondary | ICD-10-CM | POA: Diagnosis not present

## 2017-12-13 DIAGNOSIS — E663 Overweight: Secondary | ICD-10-CM | POA: Diagnosis not present

## 2017-12-13 DIAGNOSIS — J45909 Unspecified asthma, uncomplicated: Secondary | ICD-10-CM | POA: Diagnosis not present

## 2017-12-23 DIAGNOSIS — F4310 Post-traumatic stress disorder, unspecified: Secondary | ICD-10-CM | POA: Diagnosis not present

## 2018-01-06 DIAGNOSIS — F4310 Post-traumatic stress disorder, unspecified: Secondary | ICD-10-CM | POA: Diagnosis not present

## 2018-01-06 DIAGNOSIS — F319 Bipolar disorder, unspecified: Secondary | ICD-10-CM | POA: Diagnosis not present

## 2018-02-03 DIAGNOSIS — F4310 Post-traumatic stress disorder, unspecified: Secondary | ICD-10-CM | POA: Diagnosis not present

## 2018-02-16 ENCOUNTER — Encounter: Payer: Self-pay | Admitting: Neurology

## 2018-02-16 DIAGNOSIS — R05 Cough: Secondary | ICD-10-CM | POA: Diagnosis not present

## 2018-02-16 DIAGNOSIS — J309 Allergic rhinitis, unspecified: Secondary | ICD-10-CM | POA: Diagnosis not present

## 2018-02-23 DIAGNOSIS — S61459A Open bite of unspecified hand, initial encounter: Secondary | ICD-10-CM | POA: Diagnosis not present

## 2018-03-26 DIAGNOSIS — L089 Local infection of the skin and subcutaneous tissue, unspecified: Secondary | ICD-10-CM | POA: Diagnosis not present

## 2018-03-26 DIAGNOSIS — S80862A Insect bite (nonvenomous), left lower leg, initial encounter: Secondary | ICD-10-CM | POA: Diagnosis not present

## 2018-03-26 DIAGNOSIS — W57XXXA Bitten or stung by nonvenomous insect and other nonvenomous arthropods, initial encounter: Secondary | ICD-10-CM | POA: Diagnosis not present

## 2018-03-28 ENCOUNTER — Encounter (HOSPITAL_COMMUNITY): Payer: Self-pay | Admitting: Emergency Medicine

## 2018-03-28 ENCOUNTER — Emergency Department (HOSPITAL_COMMUNITY): Payer: Medicare Other

## 2018-03-28 ENCOUNTER — Emergency Department (HOSPITAL_COMMUNITY)
Admission: EM | Admit: 2018-03-28 | Discharge: 2018-03-28 | Disposition: A | Discharge status: Home / Self Care | Payer: Medicare Other | Attending: Emergency Medicine | Admitting: Emergency Medicine

## 2018-03-28 ENCOUNTER — Other Ambulatory Visit: Payer: Self-pay

## 2018-03-28 DIAGNOSIS — J45909 Unspecified asthma, uncomplicated: Secondary | ICD-10-CM | POA: Insufficient documentation

## 2018-03-28 DIAGNOSIS — Z79899 Other long term (current) drug therapy: Secondary | ICD-10-CM | POA: Diagnosis not present

## 2018-03-28 DIAGNOSIS — S99921A Unspecified injury of right foot, initial encounter: Secondary | ICD-10-CM | POA: Diagnosis not present

## 2018-03-28 DIAGNOSIS — Z87891 Personal history of nicotine dependence: Secondary | ICD-10-CM | POA: Diagnosis not present

## 2018-03-28 DIAGNOSIS — R2242 Localized swelling, mass and lump, left lower limb: Secondary | ICD-10-CM | POA: Diagnosis present

## 2018-03-28 DIAGNOSIS — M79671 Pain in right foot: Secondary | ICD-10-CM | POA: Insufficient documentation

## 2018-03-28 DIAGNOSIS — L03116 Cellulitis of left lower limb: Secondary | ICD-10-CM | POA: Insufficient documentation

## 2018-03-28 NOTE — Discharge Instructions (Addendum)
Please read attached information. If you experience any new or worsening signs or symptoms please return to the emergency room for evaluation. Please follow-up with your primary care provider or specialist as discussed. Please use continue using Keflex as previously prescribed.

## 2018-03-28 NOTE — ED Triage Notes (Signed)
Patient to ED c/o increased swelling and redness after bug bite to L upper thigh 3 days ago. Saw PCP 2 days ago and started on Keflex.

## 2018-03-28 NOTE — ED Provider Notes (Signed)
[facility] EMERGENCY DEPARTMENT Provider Note   CSN: [id number] Arrival date & time: 03/28/18  1217     History   Chief Complaint Chief Complaint  Patient presents with  . Insect Bite    HPI [name] is a 27 y.o. female.  HPI   27 year old female presents today with complaints of insect bite.  Patient notes that 3 days ago she had a bathing suit on she felt a irritation hit her left groin and looked down and appeared to have what looked like an insect bite.  Patient notes immediate redness and swelling to the area, local hives.  Patient notes she was seen the following day and started on Keflex for presumed cellulitis.  Patient notes that the area has improved, with significant reduction in redness, she does note some faint peripheral erythema, denies any fever chills induration, or any systemic illnesses.  Patient did not see the actual insect.  New purulent drainage from the area.  Patient also notes last week she dropped a board on her right foot causing pain to the first and second MTPs.   Past Medical History:  Diagnosis Date  . Asthma   . Depression   . Nerve pain    hx shingles  . PTSD (post-traumatic stress disorder)   . Shingles 2010    Patient Active Problem List   Diagnosis Date Noted  . CIN I (cervical intraepithelial neoplasia I) 08/22/2016  . Elevated BP 02/17/2012  . EAR PAIN 12/26/2010  . DISTURBANCE OF SKIN SENSATION 10/23/2010  . VAGINITIS, CANDIDAL 02/25/2010  . BIPOLAR DISORDER UNSPECIFIED 02/25/2010  . POST TRAUMATIC STRESS SYNDROME 02/25/2010  . ATTENTION DEFICIT DISORDER 02/25/2010  . ALLERGIC RHINITIS 02/25/2010  . ASTHMA 02/25/2010  . MOLLUSCUM CONTAGIOSUM 02/25/2010    History reviewed. No pertinent surgical history.   OB History    Gravida  0   Para      Term      Preterm      AB      Living        SAB      TAB      Ectopic      Multiple      Live Births             Home Medications      Prior to Admission medications   Medication Sig Start Date End Date Taking? Authorizing Provider  norgestimate-ethinyl estradiol (ORTHO-CYCLEN,SPRINTEC,PREVIFEM) 0.25-35 MG-MCG tablet Take 1 tablet daily by mouth. 08/11/17   Marny Lowenstein, PA-C  PREVIDENT 5000 ENAMEL PROTECT 1.1-5 % PSTE USE BID IN PLACE OF REGULAR TOOTHPASTE. 11/10/16   [provider]  VENTOLIN HFA 108 (90 Base) MCG/ACT inhaler  12/27/16   [provider]    Family History Family History  Problem Relation Age of Onset  . Hepatitis Mother        C  . Hepatitis Sister   . Diabetes Sister     Social History Social History   Tobacco Use  . Smoking status: Former Games developer  . Smokeless tobacco: Never Used  Substance Use Topics  . Alcohol use: Yes  . Drug use: No     Allergies   Food   Review of Systems Review of Systems  All other systems reviewed and are negative.   Physical Exam Updated Vital Signs BP 125/79 (BP Location: Right Arm)   Pulse 90   Temp 98.7 F (37.1 C) (Oral)   Resp 20   LMP  03/22/2018 (Exact Date)   SpO2 99%   Physical Exam  Constitutional: She is oriented to person, place, and time. She appears well-developed and well-nourished.  HENT:  Head: Normocephalic and atraumatic.  Eyes: Pupils are equal, round, and reactive to light. Conjunctivae are normal. Right eye exhibits no discharge. Left eye exhibits no discharge. No scleral icterus.  Neck: Normal range of motion. No JVD present. No tracheal deviation present.  Pulmonary/Chest: Effort normal. No stridor.  Neurological: She is alert and oriented to person, place, and time. Coordination normal.  Skin:  Left groin with small papule, no fluctuance or induration, minor faint redness peripherally, no warmth to touch  Psychiatric: She has a normal mood and affect. Her behavior is normal. Judgment and thought content normal.  Nursing note and vitals reviewed.   ED Treatments / Results  Labs (all labs ordered are  listed, but only abnormal results are displayed) Labs Reviewed - No data to display  EKG None  Radiology Dg Foot Complete Right  Result Date: 03/28/2018 CLINICAL DATA:  Pain for 2 weeks.  Trauma. EXAM: RIGHT FOOT COMPLETE - 3+ VIEW COMPARISON:  None. FINDINGS: There is no evidence of fracture or dislocation. There is no evidence of arthropathy or other focal bone abnormality. Soft tissues are unremarkable. IMPRESSION: Negative. Electronically Signed   By: Elsie StainJohn T Curnes M.D.   On: 03/28/2018 13:47    Procedures Procedures (including critical care time)  Medications Ordered in ED Medications - No data to display   Initial Impression / Assessment and Plan / ED Course  I have reviewed the triage vital signs and the nursing notes.  Pertinent labs & imaging results that were available during my care of the patient were reviewed by me and considered in my medical decision making (see chart for details).     Labs:   Imaging:  Consults:  Therapeutics:  Discharge Meds:   Assessment/Plan: 27 year old female percents today status post insect bite.  Patient initially had redness that has been improving on Keflex, she does still have faint erythema but is having improvement.  She is afebrile.  Keflex appears to be working, patient will continue using this return immediately with any new or worsening signs or symptoms.  No indication that this was tickborne or would need prophylactic treatment patient verbalized understanding and agreement to today's plan had no further questions or concerns at time discharge..   Final Clinical Impressions(s) / ED Diagnoses   Final diagnoses:  Cellulitis of left lower extremity  Right foot pain    ED Discharge Orders    None       Rosalio LoudHedges, Dominga Mcduffie, PA-C 03/28/18 2148    Bethann BerkshireZammit, Joseph, MD 03/31/18 [phone]

## 2018-03-29 ENCOUNTER — Ambulatory Visit: Payer: Medicare Other | Admitting: Neurology

## 2018-05-12 ENCOUNTER — Other Ambulatory Visit: Payer: Self-pay | Admitting: Medical

## 2018-05-12 DIAGNOSIS — Z01419 Encounter for gynecological examination (general) (routine) without abnormal findings: Secondary | ICD-10-CM

## 2018-05-18 DIAGNOSIS — L255 Unspecified contact dermatitis due to plants, except food: Secondary | ICD-10-CM | POA: Diagnosis not present

## 2018-06-02 ENCOUNTER — Encounter: Payer: Self-pay | Admitting: Neurology

## 2018-06-02 ENCOUNTER — Ambulatory Visit (INDEPENDENT_AMBULATORY_CARE_PROVIDER_SITE_OTHER): Payer: Medicare Other | Admitting: Neurology

## 2018-06-02 ENCOUNTER — Other Ambulatory Visit: Payer: Self-pay

## 2018-06-02 VITALS — BP 152/80 | HR 110 | Ht 69.0 in | Wt 192.0 lb

## 2018-06-02 DIAGNOSIS — R29810 Facial weakness: Secondary | ICD-10-CM | POA: Diagnosis not present

## 2018-06-02 NOTE — Progress Notes (Signed)
NEUROLOGY CONSULTATION NOTE  [name] MRN: [id number] DOB: 07/22/1991  Referring provider: Gwinda Passe, NP Primary care provider: Gwinda Passe, NP  Reason for consult:  Intermittent righ tfacial droop  Thank you for your kind referral of [name] for consultation of the above symptoms. Although her history is well known to you, please allow me to reiterate it for the purpose of our medical record. She is alone in the office today. Records and images were personally reviewed where available.  HISTORY OF PRESENT ILLNESS: This is a pleasant 27 year old right-handed woman with a history of bipolar disorder, PTSD, presenting for evaluation of intermittent facial droop. She reports that symptoms started when she was 13, she did not notice it until it was pointed out to her. She was in the hospital for mental health and was told something was wrong with her face. She states she was told she had Bell's palsy and was told to massage her face. She does not recall having any imaging done. Since then, she feels symptoms have gotten more noticeable as she got older, worse when more stressed out or tired. She feels that the right side of her face is not working as well as the left side. She has difficulty describing her symptoms, but feels that she is "using the left side more." She has been asked what is wrong with her right eye. She states sometimes she looks in the mirror and face is symmetric, but "sometimes it is just worse." She denies any increased tearing of the right eye, she does not drool on the right side of her mouth. There is no facial tightness, spasms, pain, numbness or tingling, she "can just tell it's weaker." She denies any headaches, dizziness, dysarthria/dysphagia, neck/back pain, focal numbness/tingling/weakness in the extremities. She had shingles in her back and left flank at age 27. Since high school, she has had a few instances of urinary incontinence, not  making it to the bathroom on time. She had previously stopped her psychiatric medications, but started going back to her psychiatrist a year ago and has been taking Latuda and Depakote since then without side effects. There is no family history of similar symptoms.    PAST MEDICAL HISTORY: Past Medical History:  Diagnosis Date  . Asthma   . Depression   . Nerve pain    hx shingles  . PTSD (post-traumatic stress disorder)   . Shingles 2010    PAST SURGICAL HISTORY: History reviewed. No pertinent surgical history.  MEDICATIONS: Current Outpatient Medications on File Prior to Visit  Medication Sig Dispense Refill  . ESTARYLLA 0.25-35 MG-MCG tablet TAKE 1 TABLET BY MOUTH DAILY 28 tablet 0  . PREVIDENT 5000 ENAMEL PROTECT 1.1-5 % PSTE USE BID IN PLACE OF REGULAR TOOTHPASTE.  2  . VENTOLIN HFA 108 (90 Base) MCG/ACT inhaler      No current facility-administered medications on file prior to visit.     ALLERGIES: Allergies  Allergen Reactions  . Food Anaphylaxis    Bananas, watermelon, grapefruit, cantaloupe, avocado    FAMILY HISTORY: Family History  Problem Relation Age of Onset  . Hepatitis Mother        C  . Hepatitis Sister   . Diabetes Sister     SOCIAL HISTORY: Social History   Socioeconomic History  . Marital status: Single    Spouse name: Not on file  . Number of children: Not on file  . Years of education: Not on file  . Highest  education level: Not on file  Occupational History  . Not on file  Social Needs  . Financial resource strain: Not on file  . Food insecurity:    Worry: Not on file    Inability: Not on file  . Transportation needs:    Medical: Not on file    Non-medical: Not on file  Tobacco Use  . Smoking status: Former Games developer  . Smokeless tobacco: Never Used  Substance and Sexual Activity  . Alcohol use: Yes  . Drug use: No  . Sexual activity: Not Currently  Lifestyle  . Physical activity:    Days per week: Not on file    Minutes per  session: Not on file  . Stress: Not on file  Relationships  . Social connections:    Talks on phone: Not on file    Gets together: Not on file    Attends religious service: Not on file    Active member of club or organization: Not on file    Attends meetings of clubs or organizations: Not on file    Relationship status: Not on file  . Intimate partner violence:    Fear of current or ex partner: Not on file    Emotionally abused: Not on file    Physically abused: Not on file    Forced sexual activity: Not on file  Other Topics Concern  . Not on file  Social History Narrative  . Not on file    REVIEW OF SYSTEMS: Constitutional: No fevers, chills, or sweats, no generalized fatigue, change in appetite Eyes: No visual changes, double vision, eye pain Ear, nose and throat: No hearing loss, ear pain, nasal congestion, sore throat Cardiovascular: No chest pain, palpitations Respiratory:  No shortness of breath at rest or with exertion, wheezes GastrointestinaI: No nausea, vomiting, diarrhea, abdominal pain, fecal incontinence Genitourinary:  No dysuria, urinary retention or frequency Musculoskeletal:  No neck pain, back pain Integumentary: No rash, pruritus, skin lesions Neurological: as above Psychiatric: No depression, insomnia, anxiety Endocrine: No palpitations, fatigue, diaphoresis, mood swings, change in appetite, change in weight, increased thirst Hematologic/Lymphatic:  No anemia, purpura, petechiae. Allergic/Immunologic: no itchy/runny eyes, nasal congestion, recent allergic reactions, rashes  PHYSICAL EXAM: Vitals:   06/02/18 1021  BP: (!) 152/80  Pulse: (!) 110  SpO2: 99%   General: No acute distress, tearful due to ongoing breakup with boyfriend Head:  Normocephalic/atraumatic Eyes: Fundoscopic exam shows bilateral sharp discs, no vessel changes, exudates, or hemorrhages Neck: supple, no paraspinal tenderness, full range of motion Back: No paraspinal  tenderness Heart: regular rate and rhythm Lungs: Clear to auscultation bilaterally. Vascular: No carotid bruits. Skin/Extremities: No rash, no edema Neurological Exam: Mental status: alert and oriented to person, place, and time, no dysarthria or aphasia, Fund of knowledge is appropriate.  Recent and remote memory are intact.  Attention and concentration are normal.    Able to name objects and repeat phrases. Cranial nerves: CN I: not tested CN II: pupils equal, round and reactive to light, visual fields intact, fundi unremarkable. CN III, IV, VI:  full range of motion, no nystagmus, no ptosis CN V: facial sensation intact to pin, temperature, light touch, +corneal reflex bilaterally CN VII: upper and lower face symmetric, full strength of frontalis, orbicularis oris and orbicularis oculi muscles CN VIII: hearing intact to finger rub CN IX, X: gag intact, uvula midline CN XI: sternocleidomastoid and trapezius muscles intact CN XII: tongue midline Bulk & Tone: normal, no fasciculations. Motor: 5/5 throughout with no  pronator drift. Sensation: intact to light touch, cold, pin, vibration and joint position sense.  No extinction to double simultaneous stimulation.  Romberg test negative Deep Tendon Reflexes: +2 throughout, no ankle clonus Plantar responses: downgoing bilaterally Cerebellar: no incoordination on finger to nose, heel to shin. No dysdiadochokinesia Gait: narrow-based and steady, able to tandem walk adequately. Tremor: none  IMPRESSION: This is a pleasant 27 year old right-handed woman with a history of bipolar disorder, PTSD, presenting for evaluation of intermittent right facial weakness. She reports being told at age 3 that something was wrong with her face, and would notice worsening with stress. Her neurological exam is normal, we discussed there is no facial weakness noted on exam today. There are no signs of synkinesis that can be seen after prior severe Bell's palsy.  Etiology of symptoms unclear, she may have had prior Bell's palsy when younger and has been more sensitive to symptoms, or it may be due to underlying psychiatric issues. She was advised to keep a calendar of her symptoms and take photographs for review. No indication for imaging at this time. She may massage the area when symptomatic. Follow-up in 6 months, she knows to call for any changes.   Thank you for allowing me to participate in the care of this patient. Please do not hesitate to call for any questions or concerns.   Patrcia Dolly, M.D.  CC: Gwinda Passe, NP

## 2018-06-02 NOTE — Patient Instructions (Signed)
Great meeting you! Your exam today is normal, which is always good. Massage area when you feel the weakness. Let's keep track of symptoms, keep a calendar and take photos so we can review. Follow-up in 6 months or so, call for any changes.

## 2018-07-19 ENCOUNTER — Telehealth: Payer: Self-pay | Admitting: General Practice

## 2018-07-19 DIAGNOSIS — B379 Candidiasis, unspecified: Secondary | ICD-10-CM

## 2018-07-19 MED ORDER — FLUCONAZOLE 150 MG PO TABS: 150.0000 mg | ORAL_TABLET | Freq: Once | ORAL | 0 refills | Status: AC

## 2018-07-19 NOTE — Telephone Encounter (Signed)
Patient called & left message on nurse voicemail line stating she has a yeast infection & needs medication. Per chart review, patient had abnormal pap 07/2017 and no showed to several colposcopy appts. Called patient stating I am returning your phone call. Patient reports vaginal irritation & itching for past several days. Diflucan sent in per protocol. Discussed missed appts for colposcopy with patient. Patient states she kind of wanted to wait things out and come back for a regular check up. Patient states she doesn't want a colposcopy because the first one was terrible & really traumatizing. Patient states when she had one last time the biopsies were low grade and nothing else was done so she doesn't really see a point in this one. Discussed purpose of pap smears & colposcopies as well as abnormal pap smears. Discussed importance of following up abnormal pap smears to prevent cervical cancer. Discussed abnormal pap smears that go without follow up/treatment have the potential to turn into cervical cancer. Empathized with patient for previous experience and understanding they can be painful. Offered follow up appointment just for a pap smear in which she can also discuss those concerns/experiences with the doctor at that appt too. Offered appt tomorrow @ 155pm with Dr Shawnie Pons. Patient agrees and will come in. Patient had no questions.

## 2018-07-20 ENCOUNTER — Ambulatory Visit (INDEPENDENT_AMBULATORY_CARE_PROVIDER_SITE_OTHER): Payer: Medicare Other | Admitting: Family Medicine

## 2018-07-20 ENCOUNTER — Other Ambulatory Visit (HOSPITAL_COMMUNITY)
Admission: RE | Admit: 2018-07-20 | Discharge: 2018-07-20 | Disposition: A | Discharge status: Home / Self Care | Payer: Medicare Other | Source: Ambulatory Visit | Attending: Family Medicine | Admitting: Family Medicine

## 2018-07-20 ENCOUNTER — Encounter: Payer: Self-pay | Admitting: Family Medicine

## 2018-07-20 VITALS — BP 112/66 | HR 70 | Wt 190.0 lb

## 2018-07-20 DIAGNOSIS — Z01419 Encounter for gynecological examination (general) (routine) without abnormal findings: Secondary | ICD-10-CM | POA: Diagnosis not present

## 2018-07-20 DIAGNOSIS — Z3041 Encounter for surveillance of contraceptive pills: Secondary | ICD-10-CM | POA: Diagnosis not present

## 2018-07-20 DIAGNOSIS — Z23 Encounter for immunization: Secondary | ICD-10-CM | POA: Diagnosis not present

## 2018-07-20 DIAGNOSIS — F4310 Post-traumatic stress disorder, unspecified: Secondary | ICD-10-CM

## 2018-07-20 DIAGNOSIS — R87613 High grade squamous intraepithelial lesion on cytologic smear of cervix (HGSIL): Secondary | ICD-10-CM

## 2018-07-20 MED ORDER — NORGESTIMATE-ETH ESTRADIOL 0.25-35 MG-MCG PO TABS: 1.0000 | ORAL_TABLET | Freq: Every day | ORAL | 3 refills | Status: DC

## 2018-07-20 NOTE — Assessment & Plan Note (Signed)
New pap today--will likely decline colpo--advised of risks.

## 2018-07-20 NOTE — Progress Notes (Signed)
Refill birth control pills

## 2018-07-20 NOTE — Patient Instructions (Signed)
Preventive Care 18-39 Years, Female Preventive care refers to lifestyle choices and visits with your health care provider that can promote health and wellness. What does preventive care include?  A yearly physical exam. This is also called an annual well check.  Dental exams once or twice a year.  Routine eye exams. Ask your health care provider how often you should have your eyes checked.  Personal lifestyle choices, including: ? Daily care of your teeth and gums. ? Regular physical activity. ? Eating a healthy diet. ? Avoiding tobacco and drug use. ? Limiting alcohol use. ? Practicing safe sex. ? Taking vitamin and mineral supplements as recommended by your health care provider. What happens during an annual well check? The services and screenings done by your health care provider during your annual well check will depend on your age, overall health, lifestyle risk factors, and family history of disease. Counseling Your health care provider may ask you questions about your:  Alcohol use.  Tobacco use.  Drug use.  Emotional well-being.  Home and relationship well-being.  Sexual activity.  Eating habits.  Work and work Statistician.  Method of birth control.  Menstrual cycle.  Pregnancy history.  Screening You may have the following tests or measurements:  Height, weight, and BMI.  Diabetes screening. This is done by checking your blood sugar (glucose) after you have not eaten for a while (fasting).  Blood pressure.  Lipid and cholesterol levels. These may be checked every 5 years starting at age 38.  Skin check.  Hepatitis C blood test.  Hepatitis B blood test.  Sexually transmitted disease (STD) testing.  BRCA-related cancer screening. This may be done if you have a family history of breast, ovarian, tubal, or peritoneal cancers.  Pelvic exam and Pap test. This may be done every 3 years starting at age 38. Starting at age 30, this may be done  every 5 years if you have a Pap test in combination with an HPV test.  Discuss your test results, treatment options, and if necessary, the need for more tests with your health care provider. Vaccines Your health care provider may recommend certain vaccines, such as:  Influenza vaccine. This is recommended every year.  Tetanus, diphtheria, and acellular pertussis (Tdap, Td) vaccine. You may need a Td booster every 10 years.  Varicella vaccine. You may need this if you have not been vaccinated.  HPV vaccine. If you are 39 or younger, you may need three doses over 6 months.  Measles, mumps, and rubella (MMR) vaccine. You may need at least one dose of MMR. You may also need a second dose.  Pneumococcal 13-valent conjugate (PCV13) vaccine. You may need this if you have certain conditions and were not previously vaccinated.  Pneumococcal polysaccharide (PPSV23) vaccine. You may need one or two doses if you smoke cigarettes or if you have certain conditions.  Meningococcal vaccine. One dose is recommended if you are age 68-21 years and a first-year college student living in a residence hall, or if you have one of several medical conditions. You may also need additional booster doses.  Hepatitis A vaccine. You may need this if you have certain conditions or if you travel or work in places where you may be exposed to hepatitis A.  Hepatitis B vaccine. You may need this if you have certain conditions or if you travel or work in places where you may be exposed to hepatitis B.  Haemophilus influenzae type b (Hib) vaccine. You may need this  if you have certain risk factors.  Talk to your health care provider about which screenings and vaccines you need and how often you need them. This information is not intended to replace advice given to you by your health care provider. Make sure you discuss any questions you have with your health care provider. Document Released: 11/10/2001 Document Revised:  06/03/2016 Document Reviewed: 07/16/2015 Elsevier Interactive Patient Education  2018 [employer]  

## 2018-07-20 NOTE — Progress Notes (Signed)
   Subjective:     [name] is a 27 y.o. female and is here for a comprehensive physical exam. The patient reports problems - h/o abnormal pap with CIN1 on colpo 2 years ago. Las pap HGSIL 07/2017.  The following portions of the patient's history were reviewed and updated as appropriate: allergies, current medications, past family history, past medical history, past social history, past surgical history and problem list.  Review of Systems Pertinent items noted in HPI and remainder of comprehensive ROS otherwise negative.   Objective:    BP 112/66   Pulse 70   Wt 190 lb (86.2 kg)   LMP 07/11/2018   BMI 28.06 kg/m  General appearance: alert, cooperative and appears stated age Head: Normocephalic, without obvious abnormality, atraumatic Lungs: normal effort Heart: regular rate and rhythm Abdomen: soft, non-tender; bowel sounds normal; no masses,  no organomegaly Pelvic: cervix normal in appearance, external genitalia normal, no adnexal masses or tenderness, no cervical motion tenderness, uterus normal size, shape, and consistency and vagina normal without discharge Extremities: Homans sign is negative, no sign of DVT Skin: Skin color, texture, turgor normal. No rashes or lesions Neurologic: Grossly normal    Assessment:    Healthy female exam.      Plan:   Problem List Items Addressed This Visit      Unprioritized   POST TRAUMATIC STRESS SYNDROME   HGSIL (high grade squamous intraepithelial lesion) on Pap smear of cervix    New pap today--will likely decline colpo--advised of risks.       Other Visit Diagnoses    Encounter for surveillance of contraceptive pills    -  Primary   Relevant Medications   norgestimate-ethinyl estradiol (ORTHO-CYCLEN,SPRINTEC,PREVIFEM) 0.25-35 MG-MCG tablet   Other Relevant Orders   Cytology - PAP   Need for influenza vaccination       Relevant Orders   Flu Vaccine QUAD 36+ mos IM (Fluarix, Quad PF) (Completed)    Return in 1  year (on 07/21/2019).     See After Visit Summary for Counseling Recommendations

## 2018-07-26 ENCOUNTER — Encounter: Payer: Self-pay | Admitting: *Deleted

## 2018-07-26 LAB — CYTOLOGY - PAP
CHLAMYDIA, DNA PROBE: NEGATIVE
Diagnosis: NEGATIVE
HPV (WINDOPATH): DETECTED — AB
NEISSERIA GONORRHEA: NEGATIVE

## 2018-07-26 NOTE — Progress Notes (Signed)
Spoke with [facility] and added HPV typing to pt's pap smear per Dr. Shawnie Pons.  Requisition form faxed to cytology.

## 2018-07-27 ENCOUNTER — Encounter: Payer: Self-pay | Admitting: *Deleted

## 2018-09-30 ENCOUNTER — Telehealth: Payer: Self-pay

## 2018-09-30 ENCOUNTER — Other Ambulatory Visit: Payer: Self-pay

## 2018-09-30 DIAGNOSIS — B3731 Acute candidiasis of vulva and vagina: Secondary | ICD-10-CM

## 2018-09-30 DIAGNOSIS — B373 Candidiasis of vulva and vagina: Secondary | ICD-10-CM

## 2018-09-30 MED ORDER — FLUCONAZOLE 150 MG PO TABS: 150.0000 mg | ORAL_TABLET | Freq: Once | ORAL | 0 refills | Status: AC

## 2018-09-30 NOTE — Telephone Encounter (Signed)
Pt called on Nurse Line stating that she has a yeast infection and asked if we could send Rx to her pharmacy. 10:29am. [id number]

## 2018-09-30 NOTE — Telephone Encounter (Signed)
Called pt to advise that Rx diflucan was sent to her pharmacy. Pt verbalized understanding.

## 2018-10-13 ENCOUNTER — Telehealth: Payer: Self-pay | Admitting: General Practice

## 2018-10-13 DIAGNOSIS — B379 Candidiasis, unspecified: Secondary | ICD-10-CM

## 2018-10-13 MED ORDER — FLUCONAZOLE 150 MG PO TABS: 150.0000 mg | ORAL_TABLET | Freq: Once | ORAL | 0 refills | Status: AC

## 2018-10-13 NOTE — Telephone Encounter (Signed)
Patient called and left message on nurse voicemail line stating she was recently treated for a yeast infection but felt like the infection came right back. Called patient & asked what symptoms she was having and she states mostly just vaginal itching with mild irritation. Sent in diflucan per protocol. Told patient if after this she is still having symptoms, we recommend she come in for self swab. Patient verbalized understanding & had no questions.

## 2018-10-18 ENCOUNTER — Ambulatory Visit (INDEPENDENT_AMBULATORY_CARE_PROVIDER_SITE_OTHER): Payer: Medicare Other | Admitting: Student

## 2018-10-18 VITALS — BP 120/61 | HR 65 | Wt 192.4 lb

## 2018-10-18 DIAGNOSIS — B373 Candidiasis of vulva and vagina: Secondary | ICD-10-CM

## 2018-10-18 DIAGNOSIS — B3731 Acute candidiasis of vulva and vagina: Secondary | ICD-10-CM

## 2018-10-18 NOTE — Patient Instructions (Signed)
Vaginal Yeast infection, Adult    Vaginal yeast infection is a condition that causes vaginal discharge as well as soreness, swelling, and redness (inflammation) of the vagina. This is a common condition. Some women get this infection frequently.  What are the causes?  This condition is caused by a change in the normal balance of the yeast (candida) and bacteria that live in the vagina. This change causes an overgrowth of yeast, which causes the inflammation.  What increases the risk?  The condition is more likely to develop in women who:   Take antibiotic medicines.   Have diabetes.   Take birth control pills.   Are pregnant.   Douche often.   Have a weak body defense system (immune system).   Have been taking steroid medicines for a long time.   Frequently wear tight clothing.  What are the signs or symptoms?  Symptoms of this condition include:   White, thick, creamy vaginal discharge.   Swelling, itching, redness, and irritation of the vagina. The lips of the vagina (vulva) may be affected as well.   Pain or a burning feeling while urinating.   Pain during sex.  How is this diagnosed?  This condition is diagnosed based on:   Your medical history.   A physical exam.   A pelvic exam. Your health care provider will examine a sample of your vaginal discharge under a microscope. Your health care provider may send this sample for testing to confirm the diagnosis.  How is this treated?  This condition is treated with medicine. Medicines may be over-the-counter or prescription. You may be told to use one or more of the following:   Medicine that is taken by mouth (orally).   Medicine that is applied as a cream (topically).   Medicine that is inserted directly into the vagina (suppository).  Follow these instructions at home:    Lifestyle   Do not have sex until your health care provider approves. Tell your sex partner that you have a yeast infection. That person should go to his or her health care  provider and ask if they should also be treated.   Do not wear tight clothes, such as pantyhose or tight pants.   Wear breathable cotton underwear.  General instructions   Take or apply over-the-counter and prescription medicines only as told by your health care provider.   Eat more yogurt. This may help to keep your yeast infection from returning.   Do not use tampons until your health care provider approves.   Try taking a sitz bath to help with discomfort. This is a warm water bath that is taken while you are sitting down. The water should only come up to your hips and should cover your buttocks. Do this 3-4 times per day or as told by your health care provider.   Do not douche.   If you have diabetes, keep your blood sugar levels under control.   Keep all follow-up visits as told by your health care provider. This is important.  Contact a health care provider if:   You have a fever.   Your symptoms go away and then return.   Your symptoms do not get better with treatment.   Your symptoms get worse.   You have new symptoms.   You develop blisters in or around your vagina.   You have blood coming from your vagina and it is not your menstrual period.   You develop pain in your abdomen.  Summary     Vaginal yeast infection is a condition that causes discharge as well as soreness, swelling, and redness (inflammation) of the vagina.   This condition is treated with medicine. Medicines may be over-the-counter or prescription.   Take or apply over-the-counter and prescription medicines only as told by your health care provider.   Do not douche. Do not have sex or use tampons until your health care provider approves.   Contact a health care provider if your symptoms do not get better with treatment or your symptoms go away and then return.  This information is not intended to replace advice given to you by your health care provider. Make sure you discuss any questions you have with your health care  provider.  Document Released: 06/24/2005 Document Revised: 01/31/2018 Document Reviewed: 01/31/2018  Elsevier Interactive Patient Education  2019 Elsevier Inc.

## 2018-10-18 NOTE — Progress Notes (Signed)
History:  Ms. [name] is a 28 y.o. G0P0 who presents to clinic today for follow up from yeast infection treatment.  She took one Diflucan on 1-3 and another on 1-16; had resolution of symptoms yesterday. Today denies discharge, itching, burning.   The following portions of the patient's history were reviewed and updated as appropriate: allergies, current medications, family history, past medical history, social history, past surgical history and problem list.  Review of Systems:  Review of Systems  Constitutional: Negative.   HENT: Negative.   Eyes: Negative.   Cardiovascular: Negative.   Genitourinary: Negative.   Musculoskeletal: Negative.   Skin: Negative.       Objective:  Physical Exam BP 120/61   Pulse 65   Wt 192 lb 6.4 oz (87.3 kg)   BMI 28.41 kg/m  Physical Exam Vitals signs reviewed.  Constitutional:      Appearance: Normal appearance.  HENT:     Head: Normocephalic.  Musculoskeletal: Normal range of motion.  Skin:    General: Skin is warm.  Neurological:     Mental Status: She is alert.    GYN exam deferred.   Labs and Imaging No results found for this or any previous visit (from the past 24 hour(s)).  No results found.   Assessment & Plan:   1. Recurrent candidiasis of vagina   -patient will try monistat if it reccurs, or she will come in for a self-swab and we may send for culture if she is having recurrent yeast infections -discussed the importance of returning for repeat Pap in October 2020, patient is amenable.  Marylene Land, CNM 10/18/2018 3:12 PM

## 2018-11-15 DIAGNOSIS — F250 Schizoaffective disorder, bipolar type: Secondary | ICD-10-CM | POA: Diagnosis not present

## 2019-01-05 ENCOUNTER — Ambulatory Visit: Payer: Self-pay | Admitting: Neurology

## 2019-06-09 ENCOUNTER — Telehealth: Payer: Self-pay | Admitting: *Deleted

## 2019-06-09 DIAGNOSIS — N898 Other specified noninflammatory disorders of vagina: Secondary | ICD-10-CM

## 2019-06-09 MED ORDER — FLUCONAZOLE 150 MG PO TABS: 150.0000 mg | ORAL_TABLET | Freq: Once | ORAL | 0 refills | Status: AC

## 2019-06-09 NOTE — Telephone Encounter (Signed)
[name] left a message she is requesting medicine she usually gets for yeast infection.  I called and she confirmed she is having vaginal itching again. I informed her I will send in RX for diflucan x1 and to call us back if her symptoms are not relieved. She voices understanding. Arbor Cohen,RN

## 2019-08-01 ENCOUNTER — Ambulatory Visit: Payer: Medicare Other | Admitting: Medical

## 2019-08-01 ENCOUNTER — Encounter: Payer: Self-pay | Admitting: Family Medicine

## 2019-09-20 ENCOUNTER — Telehealth: Payer: Self-pay | Admitting: *Deleted

## 2019-09-20 DIAGNOSIS — N898 Other specified noninflammatory disorders of vagina: Secondary | ICD-10-CM

## 2019-09-20 DIAGNOSIS — Z3041 Encounter for surveillance of contraceptive pills: Secondary | ICD-10-CM

## 2019-09-20 MED ORDER — FLUCONAZOLE 150 MG PO TABS: 150.0000 mg | ORAL_TABLET | Freq: Once | ORAL | 0 refills | Status: AC

## 2019-09-20 MED ORDER — NORGESTIMATE-ETH ESTRADIOL 0.25-35 MG-MCG PO TABS: 1.0000 | ORAL_TABLET | Freq: Every day | ORAL | 0 refills | Status: DC

## 2019-09-20 NOTE — Telephone Encounter (Signed)
[name] left a voice message asking for a refill of her birth control - states not sure if she has to see someone first. Also wants a refill of fluconazole. Asks for a call back.  I called [name] and verified she is asking for refill of her birth control pill and diflucan. I explained per protocol I can send in refill of birth control for 3 packs and then she must be seen in office for annual exam to get refill. She voices understanding.  I also asked what symptoms she was having re: diflucan. She reports vaginal itching. Diflucan sent to pharmacy per protocol.  Arzell Mcgeehan,RN

## 2019-10-24 DIAGNOSIS — Z23 Encounter for immunization: Secondary | ICD-10-CM | POA: Diagnosis not present

## 2019-11-03 DIAGNOSIS — J4520 Mild intermittent asthma, uncomplicated: Secondary | ICD-10-CM | POA: Diagnosis not present

## 2019-11-23 ENCOUNTER — Ambulatory Visit: Payer: Medicare Other | Admitting: Family Medicine

## 2019-12-02 IMAGING — DX DG FOOT COMPLETE 3+V*R*
2 series · 3 of 3 positions shown · non-contrast
Comparison: None.

CLINICAL DATA: Pain for 2 weeks.  Trauma.

EXAM:
RIGHT FOOT COMPLETE - 3+ VIEW

[Series 1: foot · 0.14mm/px · 2 of 2 slices shown]
[im 1/2]
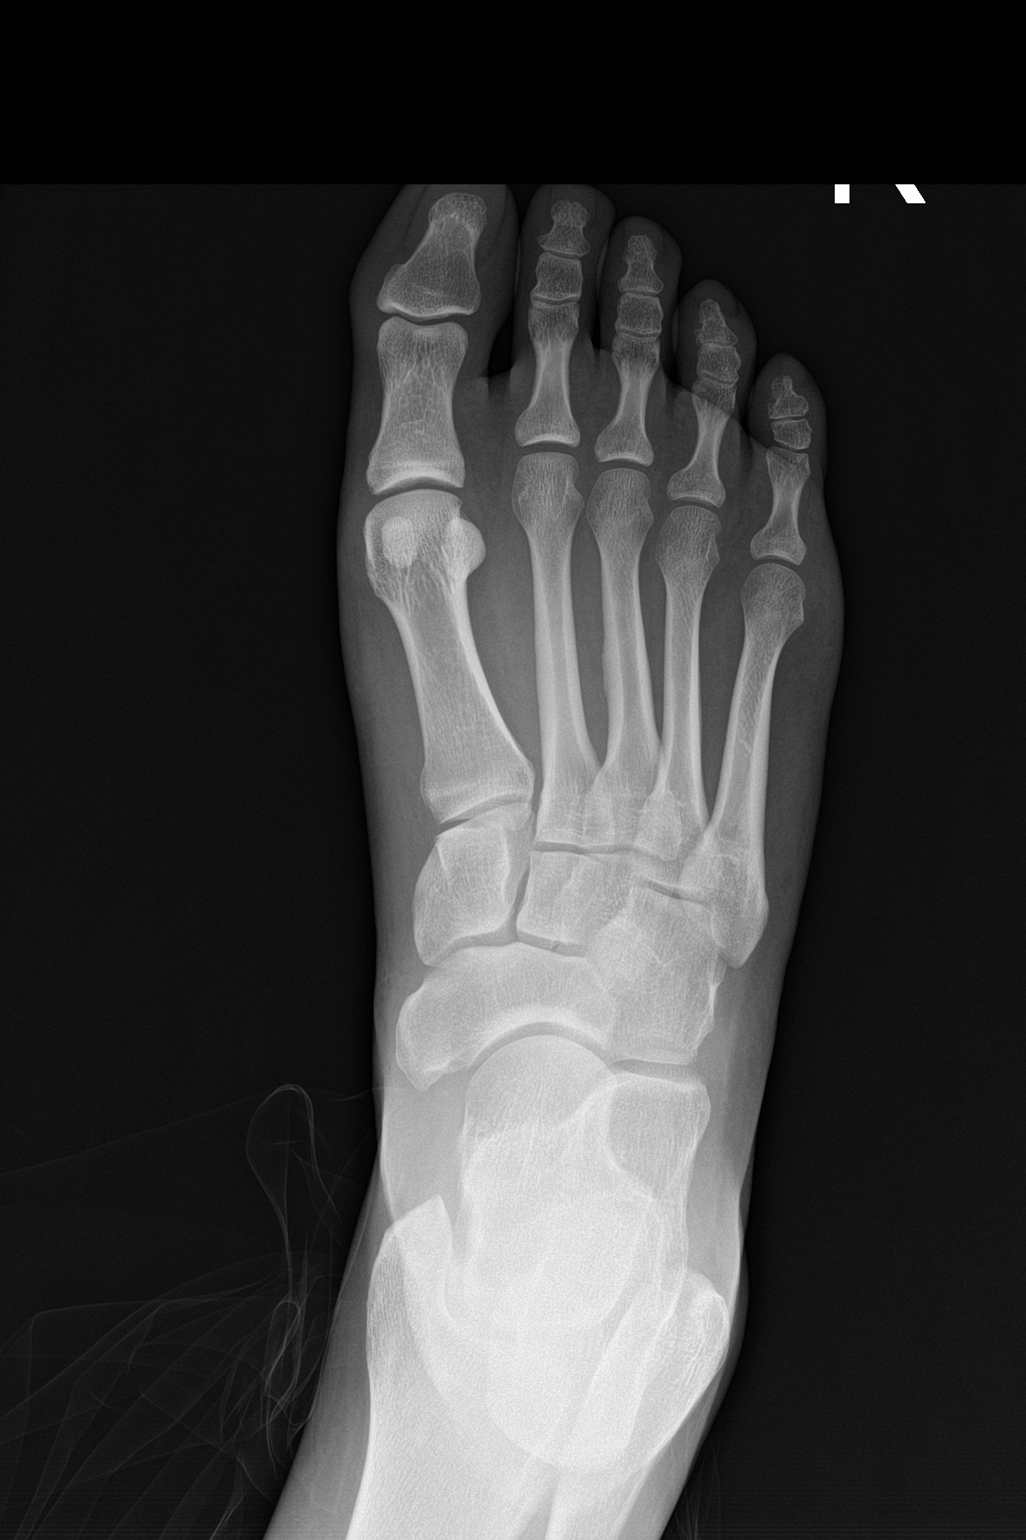
[im 2/2]
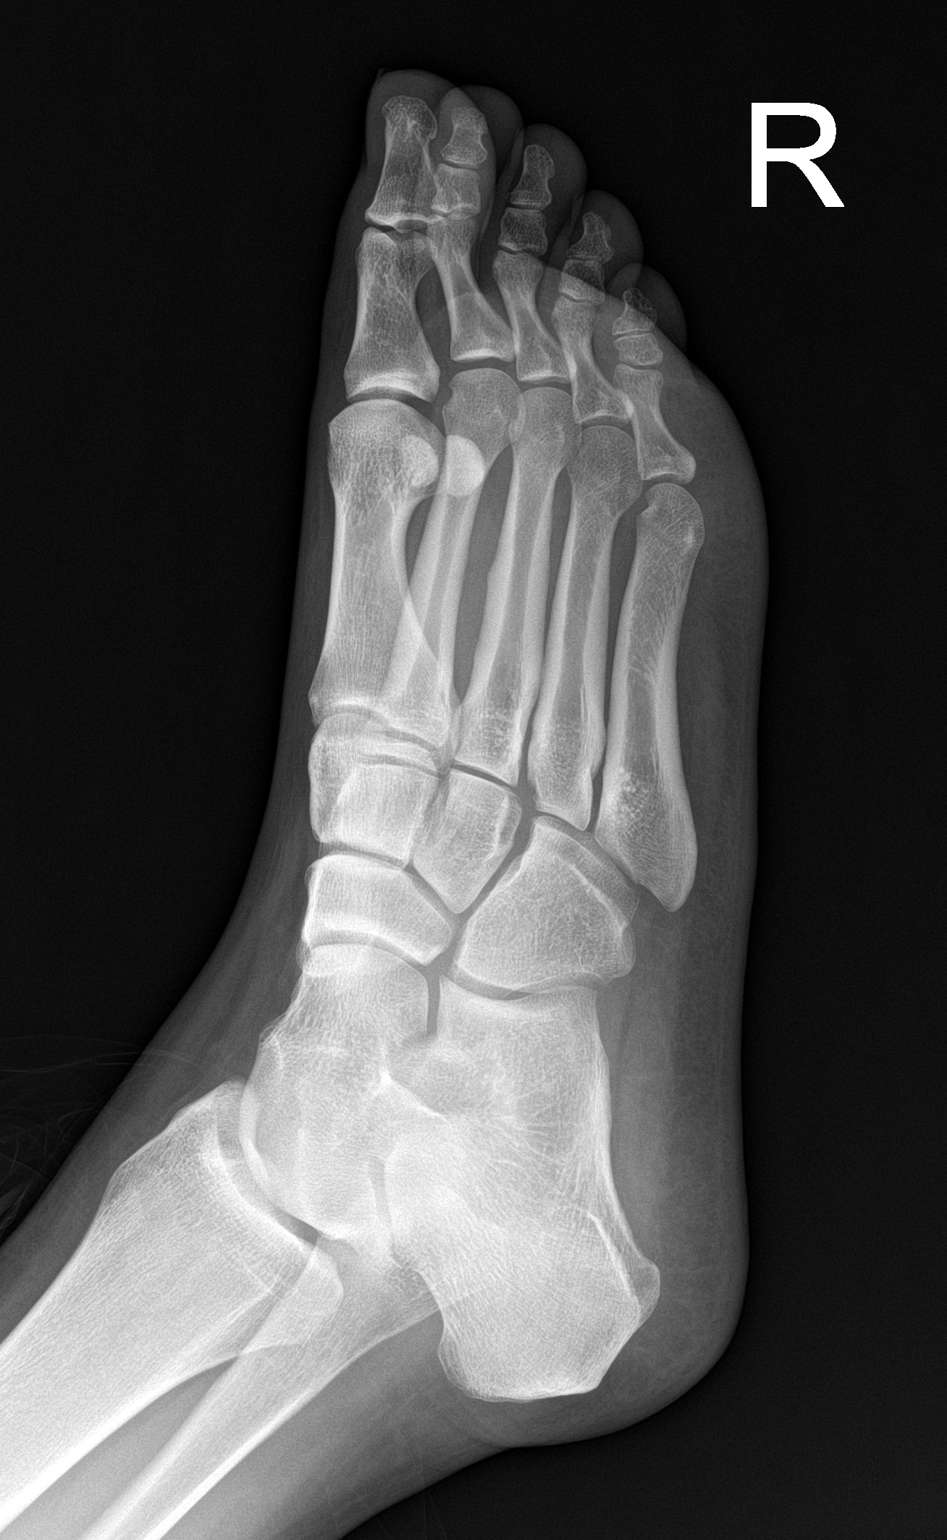

[leg]
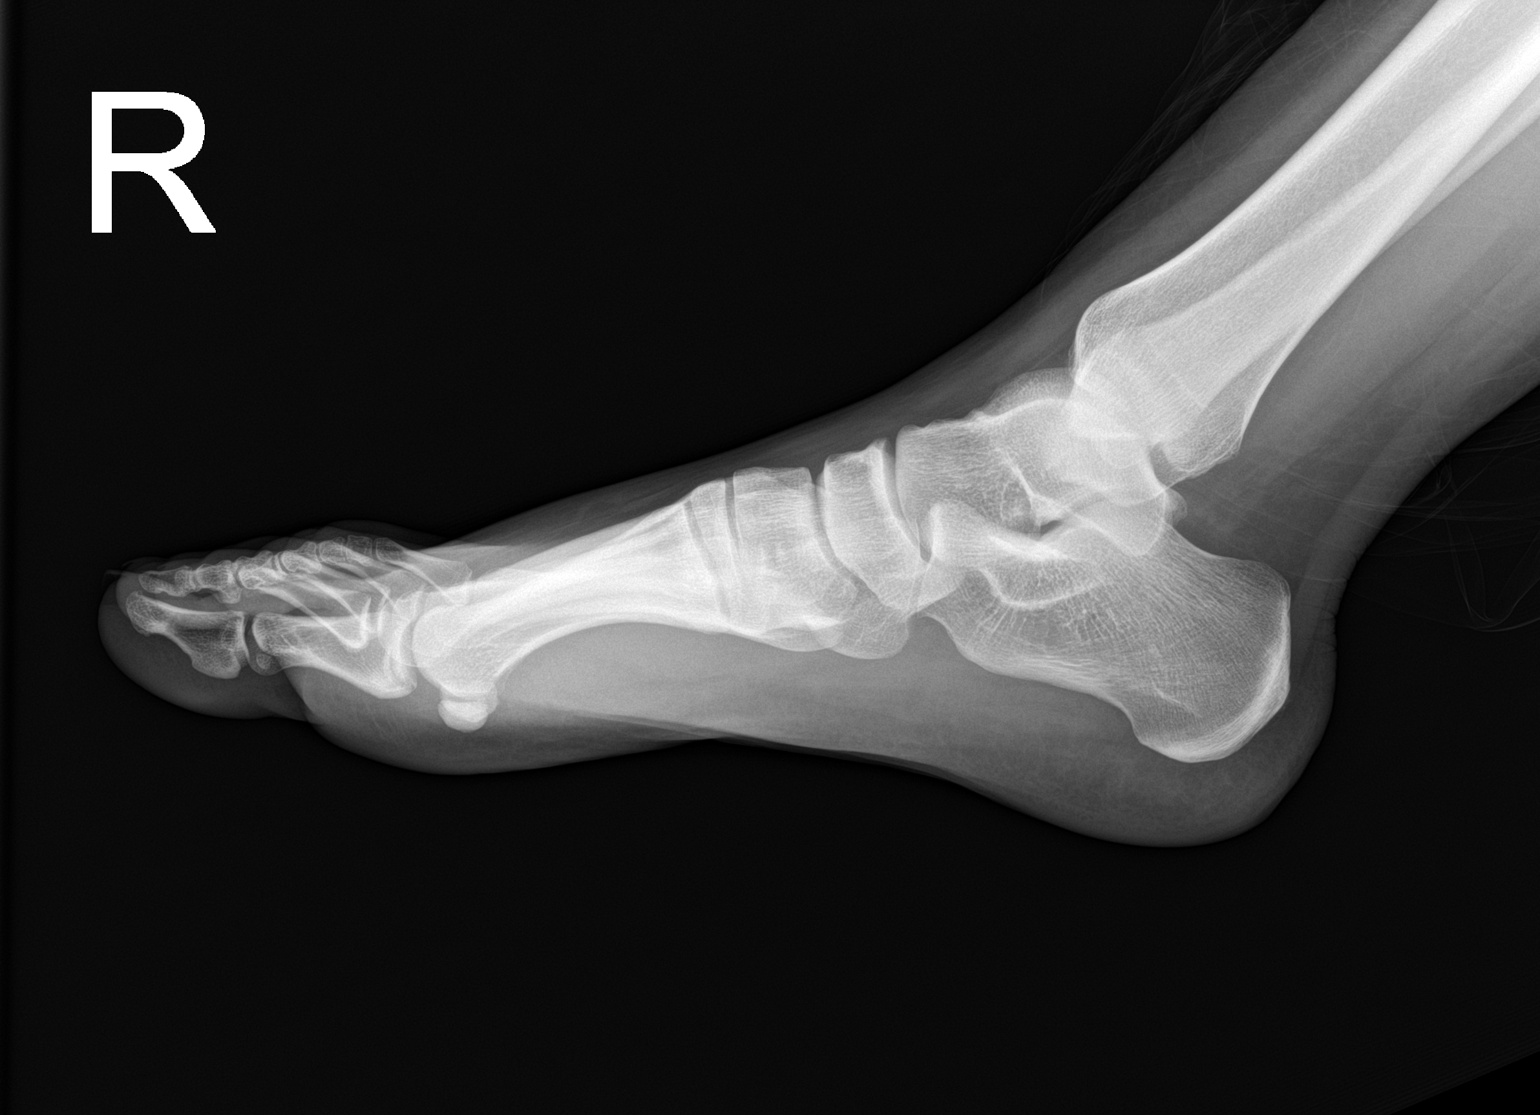

[3 of 3 positions shown; findings below may reference images not displayed]

FINDINGS: There is no evidence of fracture or dislocation. There is no
evidence of arthropathy or other focal bone abnormality. Soft
tissues are unremarkable.
IMPRESSION: Negative.

## 2019-12-13 ENCOUNTER — Other Ambulatory Visit (HOSPITAL_COMMUNITY)
Admission: RE | Admit: 2019-12-13 | Discharge: 2019-12-13 | Disposition: A | Discharge status: Home / Self Care | Payer: Medicare Other | Source: Ambulatory Visit | Attending: Family Medicine | Admitting: Family Medicine

## 2019-12-13 ENCOUNTER — Ambulatory Visit (INDEPENDENT_AMBULATORY_CARE_PROVIDER_SITE_OTHER): Payer: Medicare Other | Admitting: Family Medicine

## 2019-12-13 ENCOUNTER — Other Ambulatory Visit: Payer: Self-pay

## 2019-12-13 ENCOUNTER — Encounter: Payer: Self-pay | Admitting: Family Medicine

## 2019-12-13 VITALS — BP 132/81 | HR 83 | Ht 69.0 in | Wt 201.0 lb

## 2019-12-13 DIAGNOSIS — Z3041 Encounter for surveillance of contraceptive pills: Secondary | ICD-10-CM

## 2019-12-13 DIAGNOSIS — R87611 Atypical squamous cells cannot exclude high grade squamous intraepithelial lesion on cytologic smear of cervix (ASC-H): Secondary | ICD-10-CM | POA: Insufficient documentation

## 2019-12-13 DIAGNOSIS — Z124 Encounter for screening for malignant neoplasm of cervix: Secondary | ICD-10-CM

## 2019-12-13 DIAGNOSIS — Z793 Long term (current) use of hormonal contraceptives: Secondary | ICD-10-CM

## 2019-12-13 DIAGNOSIS — Z1151 Encounter for screening for human papillomavirus (HPV): Secondary | ICD-10-CM | POA: Diagnosis not present

## 2019-12-13 DIAGNOSIS — Z114 Encounter for screening for human immunodeficiency virus [HIV]: Secondary | ICD-10-CM | POA: Diagnosis not present

## 2019-12-13 DIAGNOSIS — F3341 Major depressive disorder, recurrent, in partial remission: Secondary | ICD-10-CM

## 2019-12-13 DIAGNOSIS — Z113 Encounter for screening for infections with a predominantly sexual mode of transmission: Secondary | ICD-10-CM

## 2019-12-13 DIAGNOSIS — E663 Overweight: Secondary | ICD-10-CM | POA: Diagnosis not present

## 2019-12-13 DIAGNOSIS — Z1272 Encounter for screening for malignant neoplasm of vagina: Secondary | ICD-10-CM | POA: Diagnosis not present

## 2019-12-13 DIAGNOSIS — Z01419 Encounter for gynecological examination (general) (routine) without abnormal findings: Secondary | ICD-10-CM

## 2019-12-13 MED ORDER — NORGESTIMATE-ETH ESTRADIOL 0.25-35 MG-MCG PO TABS: 1.0000 | ORAL_TABLET | Freq: Every day | ORAL | 3 refills | Status: DC

## 2019-12-13 NOTE — Progress Notes (Signed)
  Subjective:     [name] is a 29 y.o. female and is here for a comprehensive physical exam. The patient reports no problems. Patient on OCs and they work well. No h/o pregnancy.  The following portions of the patient's history were reviewed and updated as appropriate: allergies, current medications, past family history, past medical history, past social history, past surgical history and problem list.  Review of Systems Pertinent items are noted in HPI.   Objective:    BP 132/81   Pulse 83   Ht 5\' 9"  (1.753 m)   Wt 201 lb (91.2 kg)   LMP 12/12/2019 (Exact Date)   BMI 29.68 kg/m  General appearance: alert, cooperative, appears stated age, no distress and mildly obese Head: Normocephalic, without obvious abnormality, atraumatic Neck: no adenopathy, supple, symmetrical, trachea midline and thyroid not enlarged, symmetric, no tenderness/mass/nodules Lungs: clear to auscultation bilaterally Breasts: normal appearance, no masses or tenderness Heart: regular rate and rhythm, S1, S2 normal, no murmur, click, rub or gallop Abdomen: soft, non-tender; bowel sounds normal; no masses,  no organomegaly Pelvic: cervix normal in appearance, external genitalia normal, no adnexal masses or tenderness, no cervical motion tenderness, uterus normal size, shape, and consistency and vagina normal without discharge Extremities: Homans sign is negative, no sign of DVT Pulses: 2+ and symmetric Skin: Skin color, texture, turgor normal. No rashes or lesions or multiple tattoos Lymph nodes: Cervical, supraclavicular, and axillary nodes normal. Neurologic: Grossly normal    Assessment:    Healthy female exam.      Plan:   Problem List Items Addressed This Visit    None    Visit Diagnoses    Encounter for gynecological examination without abnormal finding    -  Primary   Relevant Orders   Lipid panel   Hemoglobin A1c   TSH   CBC   Comprehensive metabolic panel   Screening for malignant  neoplasm of cervix       Relevant Orders   Cytology - PAP( [place])   Screen for STD (sexually transmitted disease)       Relevant Orders   Hepatitis B surface antigen   Hepatitis C antibody   HIV Antibody (routine testing w rflx)   RPR   Overweight        Relevant Orders   Lipid panel   Encounter for screening for human immunodeficiency virus (HIV)        Relevant Orders   HIV Antibody (routine testing w rflx)   Long term (current) use of hormonal contraceptives        Relevant Orders   Hemoglobin A1c   Major depressive disorder, recurrent, in partial remission (HCC)        Relevant Medications   traZODone (DESYREL) 50 MG tablet   Other Relevant Orders   TSH   Encounter for surveillance of contraceptive pills       Relevant Medications   norgestimate-ethinyl estradiol (ORTHO-CYCLEN) 0.25-35 MG-MCG tablet     Return in 1 year (on 12/12/2020).    See After Visit Summary for Counseling Recommendations

## 2019-12-13 NOTE — Patient Instructions (Signed)
 Preventive Care 21-29 Years Old, Female Preventive care refers to visits with your health care provider and lifestyle choices that can promote health and wellness. This includes:  A yearly physical exam. This may also be called an annual well check.  Regular dental visits and eye exams.  Immunizations.  Screening for certain conditions.  Healthy lifestyle choices, such as eating a healthy diet, getting regular exercise, not using drugs or products that contain nicotine and tobacco, and limiting alcohol use. What can I expect for my preventive care visit? Physical exam Your health care provider will check your:  Height and weight. This may be used to calculate body mass index (BMI), which tells if you are at a healthy weight.  Heart rate and blood pressure.  Skin for abnormal spots. Counseling Your health care provider may ask you questions about your:  Alcohol, tobacco, and drug use.  Emotional well-being.  Home and relationship well-being.  Sexual activity.  Eating habits.  Work and work environment.  Method of birth control.  Menstrual cycle.  Pregnancy history. What immunizations do I need?  Influenza (flu) vaccine  This is recommended every year. Tetanus, diphtheria, and pertussis (Tdap) vaccine  You may need a Td booster every 10 years. Varicella (chickenpox) vaccine  You may need this if you have not been vaccinated. Human papillomavirus (HPV) vaccine  If recommended by your health care provider, you may need three doses over 6 months. Measles, mumps, and rubella (MMR) vaccine  You may need at least one dose of MMR. You may also need a second dose. Meningococcal conjugate (MenACWY) vaccine  One dose is recommended if you are age 19-21 years and a first-year college student living in a residence hall, or if you have one of several medical conditions. You may also need additional booster doses. Pneumococcal conjugate (PCV13) vaccine  You may need  this if you have certain conditions and were not previously vaccinated. Pneumococcal polysaccharide (PPSV23) vaccine  You may need one or two doses if you smoke cigarettes or if you have certain conditions. Hepatitis A vaccine  You may need this if you have certain conditions or if you travel or work in places where you may be exposed to hepatitis A. Hepatitis B vaccine  You may need this if you have certain conditions or if you travel or work in places where you may be exposed to hepatitis B. Haemophilus influenzae type b (Hib) vaccine  You may need this if you have certain conditions. You may receive vaccines as individual doses or as more than one vaccine together in one shot (combination vaccines). Talk with your health care provider about the risks and benefits of combination vaccines. What tests do I need?  Blood tests  Lipid and cholesterol levels. These may be checked every 5 years starting at age 20.  Hepatitis C test.  Hepatitis B test. Screening  Diabetes screening. This is done by checking your blood sugar (glucose) after you have not eaten for a while (fasting).  Sexually transmitted disease (STD) testing.  BRCA-related cancer screening. This may be done if you have a family history of breast, ovarian, tubal, or peritoneal cancers.  Pelvic exam and Pap test. This may be done every 3 years starting at age 21. Starting at age 30, this may be done every 5 years if you have a Pap test in combination with an HPV test. Talk with your health care provider about your test results, treatment options, and if necessary, the need for more   tests. Follow these instructions at home: Eating and drinking   Eat a diet that includes fresh fruits and vegetables, whole grains, lean protein, and low-fat dairy.  Take vitamin and mineral supplements as recommended by your health care provider.  Do not drink alcohol if: ? Your health care provider tells you not to drink. ? You are  pregnant, may be pregnant, or are planning to become pregnant.  If you drink alcohol: ? Limit how much you have to 0-1 drink a day. ? Be aware of how much alcohol is in your drink. In the [place] one drink equals one 12 oz bottle of beer (355 mL), one 5 oz glass of wine (148 mL), or one 1 oz glass of hard liquor (44 mL). Lifestyle  Take daily care of your teeth and gums.  Stay active. Exercise for at least 30 minutes on 5 or more days each week.  Do not use any products that contain nicotine or tobacco, such as cigarettes, e-cigarettes, and chewing tobacco. If you need help quitting, ask your health care provider.  If you are sexually active, practice safe sex. Use a condom or other form of birth control (contraception) in order to prevent pregnancy and STIs (sexually transmitted infections). If you plan to become pregnant, see your health care provider for a preconception visit. What's next?  Visit your health care provider once a year for a well check visit.  Ask your health care provider how often you should have your eyes and teeth checked.  Stay up to date on all vaccines. This information is not intended to replace advice given to you by your health care provider. Make sure you discuss any questions you have with your health care provider. Document Revised: 05/26/2018 Document Reviewed: 05/26/2018 Elsevier Patient Education  2020 Elsevier Inc.  

## 2019-12-15 LAB — CYTOLOGY - PAP
Chlamydia: NEGATIVE
Comment: NEGATIVE
Comment: NEGATIVE
Comment: NEGATIVE
Comment: NEGATIVE
Comment: NEGATIVE
Comment: NORMAL
Diagnosis: HIGH — AB
HPV 16: NEGATIVE
HPV 18 / 45: NEGATIVE
High risk HPV: POSITIVE — AB
Neisseria Gonorrhea: NEGATIVE
Trichomonas: NEGATIVE

## 2019-12-18 ENCOUNTER — Telehealth (INDEPENDENT_AMBULATORY_CARE_PROVIDER_SITE_OTHER): Payer: Medicare Other | Admitting: Lactation Services

## 2019-12-18 DIAGNOSIS — R87613 High grade squamous intraepithelial lesion on cytologic smear of cervix (HGSIL): Secondary | ICD-10-CM

## 2019-12-18 NOTE — Telephone Encounter (Signed)
-----   Message from Reva Bores, MD sent at 12/15/2019  4:07 PM EDT ----- Needs colpo

## 2019-12-18 NOTE — Telephone Encounter (Signed)
Called patient to inform her of the Pap results and that the recommendation is that she have a Colpo. Patient reports she has had a Colpo previously and that it was terrible and she does not want to have it again. Patient reports she was informed that after her Colpo that everything was normal.   Reviewed with patient that the purpose of the procedure is to evaluate for cervical changes and to detect if cancerous cells are present and/or to what extent they are present. Patient voiced understanding to this and voiced appreciation for the call but she does not want a Colpo.   Patient voiced that she will not come in for a Colpo and that she will come in in another year for reevaluation. Documentation routed to Dr. Shawnie Pons.

## 2019-12-19 LAB — COMPREHENSIVE METABOLIC PANEL
ALT: 10 IU/L (ref 0–32)
AST: 19 IU/L (ref 0–40)
Albumin/Globulin Ratio: 1.9 (ref 1.2–2.2)
Albumin: 4.9 g/dL (ref 3.9–5.0)
Alkaline Phosphatase: 67 IU/L (ref 39–117)
BUN/Creatinine Ratio: 9 (ref 9–23)
BUN: 9 mg/dL (ref 6–20)
Bilirubin Total: 0.2 mg/dL (ref 0.0–1.2)
CO2: 25 mmol/L (ref 20–29)
Calcium: 9.5 mg/dL (ref 8.7–10.2)
Chloride: 103 mmol/L (ref 96–106)
Creatinine, Ser: 1.01 mg/dL — ABNORMAL HIGH (ref 0.57–1.00)
GFR calc Af Amer: 88 mL/min/{1.73_m2} (ref >59)
GFR calc non Af Amer: 76 mL/min/{1.73_m2} (ref >59)
Globulin, Total: 2.6 g/dL (ref 1.5–4.5)
Glucose: 97 mg/dL (ref 65–99)
Potassium: 4.4 mmol/L (ref 3.5–5.2)
Sodium: 140 mmol/L (ref 134–144)
Total Protein: 7.5 g/dL (ref 6.0–8.5)

## 2019-12-19 LAB — HEPATITIS B SURFACE ANTIGEN: Hepatitis B Surface Ag: NEGATIVE

## 2019-12-19 LAB — CBC
Hematocrit: 37.6 % (ref 34.0–46.6)
Hemoglobin: 12.9 g/dL (ref 11.1–15.9)
MCH: 30.7 pg (ref 26.6–33.0)
MCHC: 34.3 g/dL (ref 31.5–35.7)
MCV: 90 fL (ref 79–97)
Platelets: 385 10*3/uL (ref 150–450)
RBC: 4.2 x10E6/uL (ref 3.77–5.28)
RDW: 11.8 % (ref 11.7–15.4)
WBC: 8.1 10*3/uL (ref 3.4–10.8)

## 2019-12-19 LAB — LIPID PANEL
Chol/HDL Ratio: 2.4 ratio (ref 0.0–4.4)
Cholesterol, Total: 182 mg/dL (ref 100–199)
HDL: 75 mg/dL (ref >39)
LDL Chol Calc (NIH): 93 mg/dL (ref 0–99)
Triglycerides: 76 mg/dL (ref 0–149)
VLDL Cholesterol Cal: 14 mg/dL (ref 5–40)

## 2019-12-19 LAB — RPR: RPR Ser Ql: NONREACTIVE

## 2019-12-19 LAB — HIV ANTIBODY (ROUTINE TESTING W REFLEX): HIV Screen 4th Generation wRfx: NONREACTIVE

## 2019-12-19 LAB — HEMOGLOBIN A1C
Est. average glucose Bld gHb Est-mCnc: 97 mg/dL
Hgb A1c MFr Bld: 5 % (ref 4.8–5.6)

## 2019-12-19 LAB — TSH: TSH: 1.74 u[IU]/mL (ref 0.450–4.500)

## 2019-12-19 LAB — HEPATITIS C ANTIBODY: Hep C Virus Ab: 0.1 s/co ratio (ref 0.0–0.9)

## 2020-02-27 DIAGNOSIS — F250 Schizoaffective disorder, bipolar type: Secondary | ICD-10-CM | POA: Diagnosis not present

## 2020-02-27 DIAGNOSIS — Z7689 Persons encountering health services in other specified circumstances: Secondary | ICD-10-CM | POA: Diagnosis not present

## 2020-02-27 DIAGNOSIS — L989 Disorder of the skin and subcutaneous tissue, unspecified: Secondary | ICD-10-CM | POA: Diagnosis not present

## 2020-04-08 DIAGNOSIS — F4310 Post-traumatic stress disorder, unspecified: Secondary | ICD-10-CM | POA: Diagnosis not present

## 2020-07-16 ENCOUNTER — Ambulatory Visit (INDEPENDENT_AMBULATORY_CARE_PROVIDER_SITE_OTHER): Payer: Medicare Other

## 2020-07-16 ENCOUNTER — Other Ambulatory Visit: Payer: Self-pay

## 2020-07-16 DIAGNOSIS — Z3200 Encounter for pregnancy test, result unknown: Secondary | ICD-10-CM

## 2020-07-16 DIAGNOSIS — Z3202 Encounter for pregnancy test, result negative: Secondary | ICD-10-CM | POA: Diagnosis not present

## 2020-07-16 LAB — POCT PREGNANCY, URINE: Preg Test, Ur: NEGATIVE

## 2020-07-16 NOTE — Progress Notes (Signed)
Patient was assessed and managed by nursing staff during this encounter. I have reviewed the chart and agree with the documentation and plan. I have also made any necessary editorial changes.  Catalina Antigua, MD 07/16/2020 3:35 PM

## 2020-07-16 NOTE — Progress Notes (Signed)
Pt left urine in office for UPT. UPT today is negative. Called pt with results. Pt reports taking UPT once daily for past 3 days, all negative. Concerned for pregnancy because period tracking app shows her period is 8 days late. Not currently using any birth control. Per chart review pt has active OCP rx. Pt states she took birth control pills earlier this year until she lost the pack. Pt was unsure how to restart. Explained pt may take another UPT 2 weeks from last time of unprotected intercourse. If test is negative, pt may restart OCPs. Encouraged pt to notify the office when she needs more refills. Encouraged pt to follow up with office if she is unable to take pills regularly and would like to discuss alternative birth control methods.   Fleet Contras RN 07/16/20

## 2020-08-05 DIAGNOSIS — F341 Dysthymic disorder: Secondary | ICD-10-CM | POA: Diagnosis not present

## 2020-09-19 DIAGNOSIS — J4520 Mild intermittent asthma, uncomplicated: Secondary | ICD-10-CM | POA: Diagnosis not present

## 2020-10-01 DIAGNOSIS — F341 Dysthymic disorder: Secondary | ICD-10-CM | POA: Diagnosis not present

## 2020-11-04 DIAGNOSIS — F341 Dysthymic disorder: Secondary | ICD-10-CM | POA: Diagnosis not present

## 2020-11-18 DIAGNOSIS — F341 Dysthymic disorder: Secondary | ICD-10-CM | POA: Diagnosis not present

## 2020-12-02 DIAGNOSIS — F341 Dysthymic disorder: Secondary | ICD-10-CM | POA: Diagnosis not present

## 2020-12-16 DIAGNOSIS — F341 Dysthymic disorder: Secondary | ICD-10-CM | POA: Diagnosis not present

## 2021-01-07 ENCOUNTER — Other Ambulatory Visit: Payer: Self-pay | Admitting: Family Medicine

## 2021-01-07 DIAGNOSIS — Z3041 Encounter for surveillance of contraceptive pills: Secondary | ICD-10-CM

## 2021-01-07 MED ORDER — NORGESTIMATE-ETH ESTRADIOL 0.25-35 MG-MCG PO TABS: 1.0000 | ORAL_TABLET | Freq: Every day | ORAL | 3 refills | Status: DC

## 2021-02-28 DIAGNOSIS — F32A Depression, unspecified: Secondary | ICD-10-CM | POA: Diagnosis not present

## 2021-02-28 DIAGNOSIS — F259 Schizoaffective disorder, unspecified: Secondary | ICD-10-CM | POA: Diagnosis not present

## 2021-02-28 DIAGNOSIS — L600 Ingrowing nail: Secondary | ICD-10-CM | POA: Diagnosis not present

## 2021-02-28 DIAGNOSIS — J4520 Mild intermittent asthma, uncomplicated: Secondary | ICD-10-CM | POA: Diagnosis not present

## 2021-03-12 ENCOUNTER — Other Ambulatory Visit: Payer: Self-pay | Admitting: Lactation Services

## 2021-03-12 ENCOUNTER — Encounter: Payer: Self-pay | Admitting: Lactation Services

## 2021-03-12 DIAGNOSIS — Z3041 Encounter for surveillance of contraceptive pills: Secondary | ICD-10-CM

## 2021-03-12 MED ORDER — NORGESTIMATE-ETH ESTRADIOL 0.25-35 MG-MCG PO TABS: 1.0000 | ORAL_TABLET | Freq: Every day | ORAL | 3 refills | Status: DC

## 2021-03-12 NOTE — Progress Notes (Signed)
OCP's ordered at patients request. Patient informed via My Chart she needs to make a follow up appointment for further refills. Last seen in office 12/13/2019 for annual exam.

## 2021-03-24 ENCOUNTER — Telehealth: Payer: Self-pay | Admitting: Lactation Services

## 2021-03-24 MED ORDER — FLUCONAZOLE 150 MG PO TABS: 150.0000 mg | ORAL_TABLET | Freq: Once | ORAL | 0 refills | Status: AC

## 2021-03-24 NOTE — Telephone Encounter (Signed)
Patient called in and asked for a prescription for Fluconazole.   Called patient and she reprts she is feeling uncomfortable and feeling itchy in vaginal area. She denies any discharge at this time. She reports she usually gets them after her period and just finished her period.  Per chart review she was seen in the office in March 2021 and has been prescribed Diflucan in the past for similar issues.   Sent in prescription per standing order and advised patient to call the office is symptoms are not resolved and come in for vaginal swab.

## 2021-04-18 DIAGNOSIS — F250 Schizoaffective disorder, bipolar type: Secondary | ICD-10-CM | POA: Diagnosis not present

## 2021-04-18 DIAGNOSIS — F4310 Post-traumatic stress disorder, unspecified: Secondary | ICD-10-CM | POA: Diagnosis not present

## 2021-04-21 DIAGNOSIS — L03031 Cellulitis of right toe: Secondary | ICD-10-CM | POA: Diagnosis not present

## 2021-04-21 DIAGNOSIS — M7752 Other enthesopathy of left foot: Secondary | ICD-10-CM | POA: Diagnosis not present

## 2021-04-21 DIAGNOSIS — L03032 Cellulitis of left toe: Secondary | ICD-10-CM | POA: Diagnosis not present

## 2021-04-21 DIAGNOSIS — M7751 Other enthesopathy of right foot: Secondary | ICD-10-CM | POA: Diagnosis not present

## 2021-04-21 DIAGNOSIS — L600 Ingrowing nail: Secondary | ICD-10-CM | POA: Diagnosis not present

## 2021-05-05 DIAGNOSIS — L600 Ingrowing nail: Secondary | ICD-10-CM | POA: Diagnosis not present

## 2021-05-05 DIAGNOSIS — F250 Schizoaffective disorder, bipolar type: Secondary | ICD-10-CM | POA: Diagnosis not present

## 2021-05-13 DIAGNOSIS — F250 Schizoaffective disorder, bipolar type: Secondary | ICD-10-CM | POA: Diagnosis not present

## 2021-05-19 DIAGNOSIS — F250 Schizoaffective disorder, bipolar type: Secondary | ICD-10-CM | POA: Diagnosis not present

## 2021-05-28 DIAGNOSIS — Z309 Encounter for contraceptive management, unspecified: Secondary | ICD-10-CM | POA: Diagnosis not present

## 2021-05-28 DIAGNOSIS — Z0000 Encounter for general adult medical examination without abnormal findings: Secondary | ICD-10-CM | POA: Diagnosis not present

## 2021-05-28 DIAGNOSIS — Z13220 Encounter for screening for lipoid disorders: Secondary | ICD-10-CM | POA: Diagnosis not present

## 2021-05-28 DIAGNOSIS — Z113 Encounter for screening for infections with a predominantly sexual mode of transmission: Secondary | ICD-10-CM | POA: Diagnosis not present

## 2021-06-06 DIAGNOSIS — F250 Schizoaffective disorder, bipolar type: Secondary | ICD-10-CM | POA: Diagnosis not present

## 2021-06-12 DIAGNOSIS — F250 Schizoaffective disorder, bipolar type: Secondary | ICD-10-CM | POA: Diagnosis not present

## 2021-06-19 DIAGNOSIS — F250 Schizoaffective disorder, bipolar type: Secondary | ICD-10-CM | POA: Diagnosis not present

## 2021-06-30 DIAGNOSIS — F1211 Cannabis abuse, in remission: Secondary | ICD-10-CM | POA: Diagnosis not present

## 2021-06-30 DIAGNOSIS — F4310 Post-traumatic stress disorder, unspecified: Secondary | ICD-10-CM | POA: Diagnosis not present

## 2021-06-30 DIAGNOSIS — F411 Generalized anxiety disorder: Secondary | ICD-10-CM | POA: Diagnosis not present

## 2021-06-30 DIAGNOSIS — F250 Schizoaffective disorder, bipolar type: Secondary | ICD-10-CM | POA: Diagnosis not present

## 2021-07-09 DIAGNOSIS — F250 Schizoaffective disorder, bipolar type: Secondary | ICD-10-CM | POA: Diagnosis not present

## 2021-07-23 DIAGNOSIS — F250 Schizoaffective disorder, bipolar type: Secondary | ICD-10-CM | POA: Diagnosis not present

## 2021-08-06 DIAGNOSIS — F250 Schizoaffective disorder, bipolar type: Secondary | ICD-10-CM | POA: Diagnosis not present

## 2021-08-27 DIAGNOSIS — F250 Schizoaffective disorder, bipolar type: Secondary | ICD-10-CM | POA: Diagnosis not present

## 2021-09-16 DIAGNOSIS — F4310 Post-traumatic stress disorder, unspecified: Secondary | ICD-10-CM | POA: Diagnosis not present

## 2021-09-16 DIAGNOSIS — F1211 Cannabis abuse, in remission: Secondary | ICD-10-CM | POA: Diagnosis not present

## 2021-09-16 DIAGNOSIS — F411 Generalized anxiety disorder: Secondary | ICD-10-CM | POA: Diagnosis not present

## 2021-09-16 DIAGNOSIS — F3281 Premenstrual dysphoric disorder: Secondary | ICD-10-CM | POA: Diagnosis not present

## 2021-09-16 DIAGNOSIS — F250 Schizoaffective disorder, bipolar type: Secondary | ICD-10-CM | POA: Diagnosis not present

## 2022-04-07 ENCOUNTER — Telehealth: Payer: Self-pay | Admitting: General Practice

## 2022-04-07 NOTE — Telephone Encounter (Signed)
Patient called and left message on nurse voicemail line stating she is calling for a prescription.   Called patient, no answer- left message stating we are trying to reach you to return your phone call, please call us back if you still need assistance.

## 2022-04-27 NOTE — Progress Notes (Unsigned)
GYNECOLOGY ANNUAL PREVENTATIVE CARE ENCOUNTER NOTE  History:     [name] is a 31 y.o. G0P0 female here for a routine annual gynecologic exam.  Current complaints: none.   Denies abnormal vaginal bleeding, discharge, pelvic pain, problems with intercourse or other gynecologic concerns.    Gynecologic History Patient's last menstrual period was 03/09/2022 (exact date). Contraception: Wants to restart OCP (estrogen/progesterone) Last Pap: 2021. Results were: ASC-H with Positive HPV. Colpo recommended and refused.  Last mammogram: None.   Pap Hx 2021: ASC-H + HR HPV->Rec Colpo, refused due to pain with previous Colpo. 2019: Nml +HR HPV 2018: HSIL + HPV->Rec Colpo Not completed? 2017: Colpo CIN 1  Obstetric History OB History  Gravida Para Term Preterm AB Living  0            SAB IAB Ectopic Multiple Live Births               Past Medical History:  Diagnosis Date  . Asthma   . Depression   . Nerve pain    hx shingles  . PTSD (post-traumatic stress disorder)   . Shingles 2010    Past Surgical History:  Procedure Laterality Date  . INCISE AND DRAIN ABCESS      Current Outpatient Medications on File Prior to Visit  Medication Sig Dispense Refill  . albuterol (VENTOLIN HFA) 108 (90 Base) MCG/ACT inhaler     . cetirizine (ZYRTEC) 10 MG tablet TK 1 T PO QD    . FLUoxetine (PROZAC) 20 MG capsule TK 1 C PO QAM    . ibuprofen (ADVIL) 200 MG tablet Take 200 mg by mouth every 6 (six) hours as needed.    Marland Kitchen LATUDA 40 MG TABS tablet TK 1 T PO QPM WC  2  . norgestimate-ethinyl estradiol (ESTARYLLA) 0.25-35 MG-MCG tablet Take 1 tablet by mouth daily. 84 tablet 3  . traZODone (DESYREL) 50 MG tablet Take 50 mg by mouth at bedtime.    . divalproex (DEPAKOTE) 500 MG DR tablet TK 1 T PO BID (Patient not taking: Reported on 04/28/2022)  2   No current facility-administered medications on file prior to visit.    Allergies  Allergen Reactions  . Food Anaphylaxis     Bananas, watermelon, grapefruit, cantaloupe, avocado    Social History:  reports that she has quit smoking. She has never used smokeless tobacco. She reports current alcohol use. She reports that she does not use drugs.  Family History  Problem Relation Age of Onset  . Hepatitis Mother        C  . Hepatitis Sister   . Diabetes Sister     The following portions of the patient's history were reviewed and updated as appropriate: allergies, current medications, past family history, past medical history, past social history, past surgical history and problem list.  Review of Systems Review of Systems  Neg for fever, chills, abd pain, vaginal discharge, vaginal bleeding, dyspareunia, urinary complaints or irregular vaginal bleeding.   Physical Exam:  BP 134/81   Pulse 72   Wt 214 lb 11.2 oz (97.4 kg)   LMP 03/09/2022 (Exact Date)   BMI 31.71 kg/m  CONSTITUTIONAL: Well-developed, well-nourished female in no acute distress.  HENT:  Normocephalic, atraumatic, Oropharynx is clear and moist EYES: Conjunctivae normal. No scleral icterus.  SKIN: Skin is warm and dry. No rash noted. Not diaphoretic. No erythema. No pallor. MUSCULOSKELETAL: Normal range of motion. No tenderness.  No cyanosis, clubbing, or edema.  NEUROLOGIC: Alert and oriented to person, place, and time. Normal muscle tone coordination.  PSYCHIATRIC: Anxious. Normal mood and affect. Normal behavior. Normal judgment and thought content. CARDIOVASCULAR: Normal heart rate noted. RESPIRATORY: Effort and rate normal, no problems with respiration noted. BREASTS: Symmetric in size. No masses, skin changes, nipple drainage, or lymphadenopathy. ABDOMEN: Soft, no distention noted.  No tenderness, rebound or guarding.  PELVIC: Normal appearing external genitalia; normal appearing vaginal mucosa and cervix. Symmetric ectropion noted. No abnormal discharge noted.  Pap smear obtained.  Normal uterine size, no other palpable masses, no  uterine or adnexal tenderness.   Assessment and Plan:    1. HGSIL (high grade squamous intraepithelial lesion) on Pap smear of cervix ***  Will follow up results of pap smear and manage accordingly. Mammogram scheduled Routine preventative health maintenance measures emphasized. Please refer to After Visit Summary for other counseling recommendations.      Dorathy Kinsman, CNM [facility] for [employer], [facility]

## 2022-04-28 ENCOUNTER — Other Ambulatory Visit: Payer: Self-pay

## 2022-04-28 ENCOUNTER — Other Ambulatory Visit (HOSPITAL_COMMUNITY)
Admission: RE | Admit: 2022-04-28 | Discharge: 2022-04-28 | Disposition: A | Discharge status: Home / Self Care | Payer: Medicare Other | Source: Ambulatory Visit | Attending: Advanced Practice Midwife | Admitting: Advanced Practice Midwife

## 2022-04-28 ENCOUNTER — Ambulatory Visit (INDEPENDENT_AMBULATORY_CARE_PROVIDER_SITE_OTHER): Payer: Medicare Other | Admitting: Advanced Practice Midwife

## 2022-04-28 VITALS — BP 134/81 | HR 72 | Wt 214.7 lb

## 2022-04-28 DIAGNOSIS — Z3202 Encounter for pregnancy test, result negative: Secondary | ICD-10-CM | POA: Diagnosis not present

## 2022-04-28 DIAGNOSIS — Z3041 Encounter for surveillance of contraceptive pills: Secondary | ICD-10-CM

## 2022-04-28 DIAGNOSIS — R87613 High grade squamous intraepithelial lesion on cytologic smear of cervix (HGSIL): Secondary | ICD-10-CM

## 2022-04-28 DIAGNOSIS — Z1151 Encounter for screening for human papillomavirus (HPV): Secondary | ICD-10-CM | POA: Insufficient documentation

## 2022-04-28 DIAGNOSIS — Z0000 Encounter for general adult medical examination without abnormal findings: Secondary | ICD-10-CM

## 2022-04-28 DIAGNOSIS — Z01419 Encounter for gynecological examination (general) (routine) without abnormal findings: Secondary | ICD-10-CM

## 2022-04-28 LAB — POCT PREGNANCY, URINE: Preg Test, Ur: NEGATIVE

## 2022-04-30 MED ORDER — NORGESTIMATE-ETH ESTRADIOL 0.25-35 MG-MCG PO TABS: 1.0000 | ORAL_TABLET | Freq: Every day | ORAL | 3 refills | Status: DC

## 2022-05-05 LAB — CYTOLOGY - PAP
Chlamydia: NEGATIVE
Comment: NEGATIVE
Comment: NEGATIVE
Comment: NEGATIVE
Comment: NEGATIVE
Comment: NEGATIVE
Comment: NORMAL
Diagnosis: HIGH — AB
HPV 16: NEGATIVE
HPV 18 / 45: NEGATIVE
High risk HPV: POSITIVE — AB
Neisseria Gonorrhea: NEGATIVE
Trichomonas: NEGATIVE

## 2022-05-07 ENCOUNTER — Telehealth: Payer: Self-pay | Admitting: Advanced Practice Midwife

## 2022-05-07 NOTE — Telephone Encounter (Signed)
Called to give pt Pap results and recommend LEEP. No answer. Left HIPAA-compliant message. MyChart message sent.

## 2022-05-07 NOTE — Progress Notes (Signed)
Your Pap smear showed high-grade squamous intraepithelial lesion and high-risk human papilloma virus (HPV) a virus that can lead to cervical cancer. Many young women with HPV can be watched closely and tested frequently and will clear the HPV on their own, but you have had it for (from the results I can see) 6 years now and your Pap smear have gotten more abnormal. I spoke to one of the physician I work with and we reviewed all of you Pap smears. She recommend a LEEP procedure which is a treatment that removes a thin layer of these abnormal cells. It would be done in the office with numbing medicine and we could give you other medicine to help you be relaxed and comfortable. Can I schedule a consultation for you to talk to Dr. Para March about the procedure?

## 2022-05-08 ENCOUNTER — Telehealth: Payer: Self-pay | Admitting: General Practice

## 2022-05-08 NOTE — Telephone Encounter (Signed)
-----   Message from [place], [place] sent at 05/07/2022  9:34 PM EDT ----- Your Pap smear showed high-grade squamous intraepithelial lesion and high-risk human papilloma virus (HPV) a virus that can lead to cervical cancer. Many young women with HPV can be watched closely and tested frequently and will clear the HPV on their own, but you have had it for (from the results I can see) 6 years now and your Pap smear have gotten more abnormal. I spoke to one of the physician I work with and we reviewed all of you Pap smears. She recommend a LEEP procedure which is a treatment that removes a thin layer of these abnormal cells. It would be done in the office with numbing medicine and we could give you other medicine to help you be relaxed and comfortable. Can I schedule a consultation for you to talk to Dr. Para March about the procedure?

## 2022-05-08 NOTE — Telephone Encounter (Signed)
Called patient, no answer- left message to call us back for results.  

## 2022-05-11 ENCOUNTER — Encounter: Payer: Self-pay | Admitting: Advanced Practice Midwife

## 2022-05-11 NOTE — Telephone Encounter (Signed)
Called pt; reviewed results and recommendation for LEEP procedure. Pt declines consultation and would prefer to schedule procedure if able. Front office notified to schedule. First available appt will be November 2023. Follow up message sent to Dorathy Kinsman, CNM and Para March, MD.

## 2022-05-13 NOTE — Telephone Encounter (Signed)
Patient left VM on 05/12/22 and requested a phone call for follow up questions. Called pt on 05/13/22.  Pt states she would like to speak with the provider prior to LEEP appt. Assured patient I will request the provider call patient once she has been scheduled. Offered sooner appt with a different provider. Pt states she would like to wait until available appt with Para March, MD which will likely be December.

## 2022-05-18 NOTE — Telephone Encounter (Unsigned)
Concerns addressed in separate encounter.

## 2022-05-31 ENCOUNTER — Ambulatory Visit (INDEPENDENT_AMBULATORY_CARE_PROVIDER_SITE_OTHER): Payer: Medicare Other

## 2022-05-31 ENCOUNTER — Encounter (HOSPITAL_COMMUNITY): Payer: Self-pay | Admitting: *Deleted

## 2022-05-31 ENCOUNTER — Other Ambulatory Visit: Payer: Self-pay

## 2022-05-31 ENCOUNTER — Ambulatory Visit (HOSPITAL_COMMUNITY)
Admission: EM | Admit: 2022-05-31 | Discharge: 2022-05-31 | Disposition: A | Discharge status: Home / Self Care | Payer: Medicare Other | Attending: Physician Assistant | Admitting: Physician Assistant

## 2022-05-31 DIAGNOSIS — M25572 Pain in left ankle and joints of left foot: Secondary | ICD-10-CM

## 2022-05-31 DIAGNOSIS — S93492A Sprain of other ligament of left ankle, initial encounter: Secondary | ICD-10-CM

## 2022-05-31 MED ORDER — IBUPROFEN 600 MG PO TABS: 600.0000 mg | ORAL_TABLET | Freq: Three times a day (TID) | ORAL | 0 refills | Status: DC

## 2022-05-31 NOTE — ED Triage Notes (Signed)
Pt reports Lt ankle injury occurred one week ago. Pt reports pain and swelling to ankle. Pt reports she was walking and rolled her ankle at time of injury.

## 2022-05-31 NOTE — Discharge Instructions (Addendum)
Advised to wear ankle support when up standing and walking around. Advised to use ice therapy, 10 minutes on 20 minutes off, 3-4 times of the evening when sitting around and relaxing. Advised take ibuprofen 600 mg every 8 hours with food to help reduce the pain and swelling. Advised to follow-up with PCP or return to urgent care if symptoms fail to improve in the next 10 days.

## 2022-05-31 NOTE — ED Provider Notes (Signed)
[facility]    CSN: [id number] Arrival date & time: 05/31/22  1222      History   Chief Complaint Chief Complaint  Patient presents with   Ankle Pain    HPI Irania YANIYAH Roberts is a 31 y.o. female.   31 year old female presents with left ankle pain.  Patient indicates that she was walking in boots the other day on a gravel surface and twisted her left ankle.  Patient indicates since then she has been having pain, swelling, and discomfort on the lateral side of her ankle.  She indicates she has been using an Ace wrap which helps to give some support however the ankle is still swelling and still with pain when she walks.  Has also been taking some ibuprofen and low-dose 2 to 400 mg which does give some relief.  Patient desires to have the area evaluated, she denies any numbness, tingling, or weakness.   Ankle Pain   Past Medical History:  Diagnosis Date   Asthma    Depression    Nerve pain    hx shingles   PTSD (post-traumatic stress disorder)    Shingles 2010    Patient Active Problem List   Diagnosis Date Noted   HGSIL (high grade squamous intraepithelial lesion) on Pap smear of cervix 08/22/2016   DISTURBANCE OF SKIN SENSATION 10/23/2010   BIPOLAR DISORDER UNSPECIFIED 02/25/2010   POST TRAUMATIC STRESS SYNDROME 02/25/2010   ATTENTION DEFICIT DISORDER 02/25/2010   ALLERGIC RHINITIS 02/25/2010   ASTHMA 02/25/2010    Past Surgical History:  Procedure Laterality Date   INCISE AND DRAIN ABCESS      OB History     Gravida  0   Para      Term      Preterm      AB      Living         SAB      IAB      Ectopic      Multiple      Live Births               Home Medications    Prior to Admission medications   Medication Sig Start Date End Date Taking? Authorizing Provider  ibuprofen (ADVIL) 600 MG tablet Take 1 tablet (600 mg total) by mouth 3 (three) times daily. 05/31/22  Yes Ellsworth Lennox, PA-C  albuterol (VENTOLIN HFA) 108 [phone]  Base) MCG/ACT inhaler  12/27/16   [provider]  cetirizine (ZYRTEC) 10 MG tablet TK 1 T PO QD 02/16/18   [provider]  divalproex (DEPAKOTE) 500 MG DR tablet TK 1 T PO BID Patient not taking: Reported on 04/28/2022 04/06/18   [provider]  FLUoxetine (PROZAC) 20 MG capsule TK 1 C PO QAM 10/01/18   [provider]  ibuprofen (ADVIL) 200 MG tablet Take 200 mg by mouth every 6 (six) hours as needed.    [provider]  LATUDA 40 MG TABS tablet TK 1 T PO QPM WC 05/12/18   [provider]  norgestimate-ethinyl estradiol (ESTARYLLA) 0.25-35 MG-MCG tablet Take 1 tablet by mouth daily. 04/30/22   Katrinka Blazing, [place], CNM  traZODone (DESYREL) 50 MG tablet Take 50 mg by mouth at bedtime.    [provider]    Family History Family History  Problem Relation Age of Onset   Hepatitis Mother        C   Hepatitis Sister    Diabetes Sister  Social History Social History   Tobacco Use   Smoking status: Former   Smokeless tobacco: Never  Substance Use Topics   Alcohol use: Yes    Comment: occasionally   Drug use: No     Allergies   Food   Review of Systems Review of Systems  Musculoskeletal:  Positive for joint swelling (left ankle).     Physical Exam Triage Vital Signs ED Triage Vitals  Enc Vitals Group     BP 05/31/22 1318 109/61     Pulse Rate 05/31/22 1318 66     Resp 05/31/22 1318 18     Temp 05/31/22 1318 99.1 F (37.3 C)     Temp src --      SpO2 05/31/22 1318 95 %     Weight --      Height --      Head Circumference --      Peak Flow --      Pain Score 05/31/22 1315 6     Pain Loc --      Pain Edu? --      Excl. in GC? --    No data found.  Updated Vital Signs BP 109/61   Pulse 66   Temp 99.1 F (37.3 C)   Resp 18   LMP 05/14/2022   SpO2 95%   Visual Acuity Right Eye Distance:   Left Eye Distance:   Bilateral Distance:    Right Eye Near:   Left Eye Near:    Bilateral Near:     Physical  Exam Constitutional:      Appearance: Normal appearance.  Musculoskeletal:       Legs:     Comments: Left ankle: 1+ swelling is present lateral malleolus with mild pain on palpation of the area.  F ROM intact, stability is intact, varus valgus stressing is normal.  Neurological:     Mental Status: She is alert.      UC Treatments / Results  Labs (all labs ordered are listed, but only abnormal results are displayed) Labs Reviewed - No data to display  EKG   Radiology DG Ankle Complete Left  Result Date: 05/31/2022 CLINICAL DATA:  Trauma, pain EXAM: LEFT ANKLE COMPLETE - 3+ VIEW COMPARISON:  None Available. FINDINGS: There is no evidence of fracture, dislocation, or joint effusion. There is no evidence of arthropathy or other focal bone abnormality. There is soft tissue swelling over the lateral malleolus. IMPRESSION: No fracture or dislocation is seen in left ankle. Electronically Signed   By: Ernie Avena M.D.   On: 05/31/2022 13:38    Procedures Procedures (including critical care time)  Medications Ordered in UC Medications - No data to display  Initial Impression / Assessment and Plan / UC Course  I have reviewed the triage vital signs and the nursing notes.  Pertinent labs & imaging results that were available during my care of the patient were reviewed by me and considered in my medical decision making (see chart for details).    Plan: 1.  Advised to wear the ankle support when up walking around and standing. 2.  Advised take ibuprofen 600 mg every 8 hours to help reduce the pain and swelling. 3.  Advised use ice therapy, 10 minutes on 20 minutes off, 3-4 times of the evening to help reduce pain and swelling. 4.  Advised to follow-up PCP or return to urgent care if symptoms fail to improve in next 10 days. Final Clinical Impressions(s) / UC Diagnoses  Final diagnoses:  Sprain of other ligament of left ankle, initial encounter  Acute left ankle pain      Discharge Instructions      Advised to wear ankle support when up standing and walking around. Advised to use ice therapy, 10 minutes on 20 minutes off, 3-4 times of the evening when sitting around and relaxing. Advised take ibuprofen 600 mg every 8 hours with food to help reduce the pain and swelling. Advised to follow-up with PCP or return to urgent care if symptoms fail to improve in the next 10 days.     ED Prescriptions     Medication Sig Dispense Auth. Provider   ibuprofen (ADVIL) 600 MG tablet Take 1 tablet (600 mg total) by mouth 3 (three) times daily. 30 tablet Ellsworth Lennox, PA-C      PDMP not reviewed this encounter.   Ellsworth Lennox, PA-C 05/31/22 1401

## 2022-07-14 ENCOUNTER — Telehealth: Payer: Self-pay

## 2022-07-15 NOTE — Telephone Encounter (Signed)
Patient left VM requesting phone call from Dr. Damita Dunnings regarding LEEP procedure later this month.

## 2022-07-16 NOTE — Telephone Encounter (Signed)
Reviewed with Damita Dunnings, MD who recommends visit for consult or phone call with RN to review questions. Called pt; VM left. MyChart message sent.

## 2022-07-16 NOTE — Telephone Encounter (Signed)
Called pt and discussed her concern. She requests that Dr. Damita Dunnings call her prior to the appt on 10/30 for LEEP.  She has several questions. Pt also stated that she was told she will be able to have medication for anxiety before the LEEP.  She does not have anyone to drive her to the appt and wanted to know if this is still ok.  I stated that she will need to discuss with Dr. Damita Dunnings and I will send a message. Pt expressed gratitude and voiced understanding.

## 2022-07-20 ENCOUNTER — Telehealth: Payer: Self-pay | Admitting: General Practice

## 2022-07-20 NOTE — Telephone Encounter (Signed)
Patient called and left voicemail message stating she still hasn't heard from Dr Damita Dunnings and her appt is coming up soon.   Called patient and discussed we can always bring her in on the 30th to discuss the procedure and have her questions answered and bring her back on a different day for the procedure. Patient states she just wants to come in and get it over it. Asked patient if there was questions I could answer for her or anything I could do. Patient states she doesn't even know anything about the procedure, what to expect, recovery etc. Reviewed LEEP with patient including after care and expectations. Patient verbalized understanding and states she already feels better just talking to someone and having someone explain it. She states she doesn't know if anyone can drive her so she can take anxiety meds but she is already feeling better about things. Told patient if she finds out she has a ride, she can always call us back and let us know and we can send something in. Patient verbalized understanding and had no other questions at this time.

## 2022-07-21 ENCOUNTER — Telehealth: Payer: Self-pay | Admitting: Obstetrics and Gynecology

## 2022-07-21 NOTE — Telephone Encounter (Signed)
Called pt to discuss pre-medication. She cannot have the pre-medication medicines without a ride. I left a voicemail to this effect. I did not call her back because I was out on PAL and knew her surgery was not until next week. Afterward, I saw [name]'s recent telephone encounter from yesterday. I will to reach her again to see if she has any additional questions.   Thanks, pad

## 2022-07-22 NOTE — Telephone Encounter (Signed)
Called pt again to see if she had any questions. No answer. LVM that was general regarding upcoming appointment.   Radene Gunning, MD Attending [place], [facility] for [facility], [place]

## 2022-07-27 ENCOUNTER — Other Ambulatory Visit (HOSPITAL_COMMUNITY)
Admission: RE | Admit: 2022-07-27 | Discharge: 2022-07-27 | Disposition: A | Discharge status: Home / Self Care | Payer: Medicare Other | Source: Ambulatory Visit | Attending: Obstetrics and Gynecology | Admitting: Obstetrics and Gynecology

## 2022-07-27 ENCOUNTER — Encounter: Payer: Self-pay | Admitting: Obstetrics and Gynecology

## 2022-07-27 ENCOUNTER — Ambulatory Visit (INDEPENDENT_AMBULATORY_CARE_PROVIDER_SITE_OTHER): Payer: Medicare Other | Admitting: Obstetrics and Gynecology

## 2022-07-27 VITALS — BP 141/94 | HR 80 | Ht 69.0 in | Wt 209.0 lb

## 2022-07-27 DIAGNOSIS — R87613 High grade squamous intraepithelial lesion on cytologic smear of cervix (HGSIL): Secondary | ICD-10-CM | POA: Insufficient documentation

## 2022-07-27 DIAGNOSIS — F259 Schizoaffective disorder, unspecified: Secondary | ICD-10-CM | POA: Insufficient documentation

## 2022-07-27 LAB — POCT PREGNANCY, URINE: Preg Test, Ur: NEGATIVE

## 2022-07-27 NOTE — Progress Notes (Signed)
   GYNECOLOGY OFFICE PROCEDURE NOTE  [name] is a 31 y.o. G0P0 here for LEEP. No GYN concerns. Pap smear and colposcopy history reviewed.  She has no history of prior LEEP procedure.   Pap HSIL, HPV pos (non 16/18)  Risks, benefits, alternatives, and limitations of procedure explained to patient, including pain, bleeding, infection, failure to remove abnormal tissue and failure to cure dysplasia, need for repeat procedures, damage to pelvic organs, cervical incompetence.  Role of HPV,cervical dysplasia and need for close followup was empasized. Informed written consent was obtained. All questions were answered. Time out performed. Urine pregnancy test was Negative.  ??Procedure: The patient was placed in lithotomy position and the bivalved coated speculum was placed in the patient's vagina. A grounding pad placed on the patient. Lugol's solution was applied to the cervix and areas of decreased uptake were noted around the transformation zone.   Local anesthesia was administered via an intracervical block using 10 ml of 2% Lidocaine with epinephrine. ECC done. The suction was turned on and the Medium 1X Fisher Cone Biopsy Excisor on 15 Watts of blended current was used to excise the entire transformation zone. Excellent hemostasis was achieved using roller ball coagulation set at 60 Watts coagulation current. Monsel's solution was then applied and the speculum was removed from the vagina. Specimens were sent to pathology.  ?The patient tolerated the procedure well. Post-operative instructions given to patient, including instruction to seek medical attention for persistent bright red bleeding, fever, abdominal/pelvic pain, dysuria, nausea or vomiting. She was also told about the possibility of having copious yellow to black tinged discharge for weeks. She was counseled to avoid anything in the vagina (sex/douching/tampons) for 3 weeks. She has a 4 week post-operative check to assess wound  healing, review results and discuss further management.    Radene Gunning, MD, [place] for [facility], [place]

## 2022-07-29 LAB — SURGICAL PATHOLOGY

## 2022-07-30 ENCOUNTER — Telehealth: Payer: Self-pay

## 2022-07-30 NOTE — Telephone Encounter (Signed)
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 The endocervical margins is positive. I would recommend pap and ECC in 4 months.  Clinical: can you let her know? Admin: can you get her scheduled? Thanks, pad

## 2022-07-30 NOTE — Telephone Encounter (Signed)
Call placed to pt. Spoke with pt. Pt given results and recommendations per Dr Damita Dunnings. Pt verbalized understanding and agreeable to plan of care. Pt to be called by front office for Feb f/u. Pt aware of call to schedule.  [name], RNC

## 2022-08-03 ENCOUNTER — Encounter: Payer: Self-pay | Admitting: General Practice

## 2022-08-17 ENCOUNTER — Other Ambulatory Visit: Payer: Self-pay

## 2022-08-17 ENCOUNTER — Emergency Department (HOSPITAL_COMMUNITY)
Admission: EM | Admit: 2022-08-17 | Discharge: 2022-08-18 | Disposition: A | Discharge status: Home / Self Care | Payer: Medicare Other | Attending: Emergency Medicine | Admitting: Emergency Medicine

## 2022-08-17 ENCOUNTER — Telehealth: Payer: Self-pay | Admitting: *Deleted

## 2022-08-17 ENCOUNTER — Encounter (HOSPITAL_COMMUNITY): Payer: Self-pay | Admitting: Emergency Medicine

## 2022-08-17 DIAGNOSIS — Z9889 Other specified postprocedural states: Secondary | ICD-10-CM | POA: Diagnosis not present

## 2022-08-17 DIAGNOSIS — N898 Other specified noninflammatory disorders of vagina: Secondary | ICD-10-CM | POA: Diagnosis not present

## 2022-08-17 DIAGNOSIS — J45909 Unspecified asthma, uncomplicated: Secondary | ICD-10-CM | POA: Diagnosis not present

## 2022-08-17 DIAGNOSIS — Z7951 Long term (current) use of inhaled steroids: Secondary | ICD-10-CM | POA: Diagnosis not present

## 2022-08-17 DIAGNOSIS — R102 Pelvic and perineal pain: Secondary | ICD-10-CM | POA: Diagnosis present

## 2022-08-17 DIAGNOSIS — N72 Inflammatory disease of cervix uteri: Secondary | ICD-10-CM

## 2022-08-17 MED ORDER — IBUPROFEN 400 MG PO TABS
600.0000 mg | ORAL_TABLET | Freq: Once | ORAL | Status: AC
  Administered 2022-08-17: 600 mg via ORAL
  Filled 2022-08-17: qty 1

## 2022-08-17 NOTE — Telephone Encounter (Addendum)
Pt left VM message stating that she had LEEP on 10/30.  She is having some pain and feeling uncomfortable. She wants to know if this is normal and also how much Ibuprofen she can take.   11/20  1345  Called pt and discussed her concern. She stated that she had some stress on 11/16 when the cramping started and wondered if this could have contributed to the problem. Today the cramping is not that bad. She is due to start a period next week. I advised that while mental/emotional stress can have physical repercussions, this is not the most common cause. I advised that most likely her cramping is related to her menstrual cycle. I advised ibuprofen 600 mg every 6 hrs for 24-48 hrs. She may also take some Tylenol between ibuprofen doses if needed.  Pt voiced understanding.

## 2022-08-17 NOTE — Telephone Encounter (Signed)
Patient left a voice message this afternoon that she left a message this am and someone called her back but she had to get out the door and didn't have time to think of all her questions. She states she has thought of some questions and would like someone to call her back.  [name]

## 2022-08-17 NOTE — Telephone Encounter (Signed)
I called Ange back and she reports she continues to have the pelvic pain / cramping = 5 and is taking the ibuprofen and wants to know if she can take tylenol. I reviewed with her to take the ibuprofen 600 mg every 6 hours as needed with food and if needed may take tylenol in between the tylenol. We discussed taking ibuprofen on empty stomach can cause GI distress or pain . I recommended she drink lots of water to help flush the meds out. She reports no fever, no severe pain, no unusual discharge or odor. I advised her cycle may be off and period may start early. I advised if she continues to have pain and concerned to  make an appointment to be seen and to go to ED if severe symptoms. She voices understanding. [name]

## 2022-08-17 NOTE — ED Provider Triage Note (Signed)
Emergency Medicine Provider Triage Evaluation Note  [name] , a 31 y.o. female  was evaluated in triage.  Pt complains of lower abdominal pain.  Patient states that symptoms began on the 16th of this month.  Reports history of LEEP procedure performed on 10/30.  Denies vaginal discharge/bleeding, fever, chills, night sweats, nausea, vomiting.  Reports appointment with OB/GYN on 8 December but came to the emergency department due to concern of increasing pain not responsive to ibuprofen or Tylenol outpatient.  Review of Systems  Positive: See above Negative:   Physical Exam  BP 133/79 (BP Location: Left Arm)   Pulse 80   Temp 98.3 F (36.8 C)   Resp 16   LMP 07/25/2022 (Exact Date)   SpO2 96%  Gen:   Awake, no distress   Resp:  Normal effort  MSK:   Moves extremities without difficulty  Other:    Medical Decision Making  Medically screening exam initiated at 9:23 PM.  Appropriate orders placed.  [name] was informed that the remainder of the evaluation will be completed by another provider, this initial triage assessment does not replace that evaluation, and the importance of remaining in the ED until their evaluation is complete.     Peter Garter, [place] 08/17/22 2130

## 2022-08-17 NOTE — ED Triage Notes (Signed)
Pt reported to ED for evaluation of vaginal pain after LEEP procedure on 07/27/2022. Denies discharge and bleeding.

## 2022-08-18 ENCOUNTER — Telehealth: Payer: Self-pay

## 2022-08-18 ENCOUNTER — Emergency Department (HOSPITAL_COMMUNITY): Payer: Medicare Other

## 2022-08-18 DIAGNOSIS — N898 Other specified noninflammatory disorders of vagina: Secondary | ICD-10-CM | POA: Diagnosis not present

## 2022-08-18 LAB — CBC WITH DIFFERENTIAL/PLATELET
Abs Immature Granulocytes: 0.03 10*3/uL (ref 0.00–0.07)
Basophils Absolute: 0 10*3/uL (ref 0.0–0.1)
Basophils Relative: 0 %
Eosinophils Absolute: 0.4 10*3/uL (ref 0.0–0.5)
Eosinophils Relative: 5 %
HCT: 35.6 % — ABNORMAL LOW (ref 36.0–46.0)
Hemoglobin: 12.3 g/dL (ref 12.0–15.0)
Immature Granulocytes: 0 %
Lymphocytes Relative: 29 %
Lymphs Abs: 2.7 10*3/uL (ref 0.7–4.0)
MCH: 31.5 pg (ref 26.0–34.0)
MCHC: 34.6 g/dL (ref 30.0–36.0)
MCV: 91 fL (ref 80.0–100.0)
Monocytes Absolute: 0.7 10*3/uL (ref 0.1–1.0)
Monocytes Relative: 8 %
Neutro Abs: 5.4 10*3/uL (ref 1.7–7.7)
Neutrophils Relative %: 58 %
Platelets: 314 10*3/uL (ref 150–400)
RBC: 3.91 MIL/uL (ref 3.87–5.11)
RDW: 12.3 % (ref 11.5–15.5)
WBC: 9.3 10*3/uL (ref 4.0–10.5)
nRBC: 0 % (ref 0.0–0.2)

## 2022-08-18 LAB — BASIC METABOLIC PANEL
Anion gap: 10 (ref 5–15)
BUN: 9 mg/dL (ref 6–20)
CO2: 23 mmol/L (ref 22–32)
Calcium: 8.7 mg/dL — ABNORMAL LOW (ref 8.9–10.3)
Chloride: 104 mmol/L (ref 98–111)
Creatinine, Ser: 0.89 mg/dL (ref 0.44–1.00)
GFR, Estimated: >60 mL/min (ref >60)
Glucose, Bld: 99 mg/dL (ref 70–99)
Potassium: 3.5 mmol/L (ref 3.5–5.1)
Sodium: 137 mmol/L (ref 135–145)

## 2022-08-18 LAB — WET PREP, GENITAL
Clue Cells Wet Prep HPF POC: NONE SEEN
Sperm: NONE SEEN
Trich, Wet Prep: NONE SEEN
WBC, Wet Prep HPF POC: >=10 — AB (ref <10)
Yeast Wet Prep HPF POC: NONE SEEN

## 2022-08-18 LAB — I-STAT BETA HCG BLOOD, ED (MC, WL, AP ONLY): I-stat hCG, quantitative: <5 m[IU]/mL (ref <5)

## 2022-08-18 MED ORDER — IBUPROFEN 800 MG PO TABS: 800.0000 mg | ORAL_TABLET | Freq: Three times a day (TID) | ORAL | 0 refills | Status: AC | PRN

## 2022-08-18 MED ORDER — KETOROLAC TROMETHAMINE 30 MG/ML IJ SOLN
30.0000 mg | Freq: Once | INTRAMUSCULAR | Status: AC
  Administered 2022-08-18: 30 mg via INTRAVENOUS
  Filled 2022-08-18: qty 1

## 2022-08-18 MED ORDER — DOXYCYCLINE HYCLATE 100 MG PO TABS
100.0000 mg | ORAL_TABLET | Freq: Once | ORAL | Status: AC
  Administered 2022-08-18: 100 mg via ORAL
  Filled 2022-08-18: qty 1

## 2022-08-18 MED ORDER — SODIUM CHLORIDE 0.9 % IV BOLUS
1000.0000 mL | Freq: Once | INTRAVENOUS | Status: AC
Start: 2022-08-18 — End: 2022-08-18
  Administered 2022-08-18: 1000 mL via INTRAVENOUS

## 2022-08-18 MED ORDER — DOXYCYCLINE HYCLATE 100 MG PO CAPS: 100.0000 mg | ORAL_CAPSULE | Freq: Two times a day (BID) | ORAL | 0 refills | Status: AC

## 2022-08-18 NOTE — Telephone Encounter (Signed)
Patient left VM on nurse line requesting call to ask if she should continue antibiotics given yesterday at emergency department.

## 2022-08-18 NOTE — ED Provider Notes (Signed)
[facility] EMERGENCY DEPARTMENT Provider Note   CSN: [id number] Arrival date & time: 08/17/22  2048     History  Chief Complaint  Patient presents with   Vaginal Pain    [name] is a 31 y.o. female.  Pt is a 31 yo female with a pmhx significant for asthma, depression, and ptsd.  She had a LEEP procedure on 10/30 by Dr. Milas Hock here at [facility].  Pt said she was ok until 11/16 when she developed a horrible pain in her cervix.  She said it happened after she was really stressed out.  She denies sexual activity.  No vaginal trauma.  Pt said she has been having intermittent spasms in her cervix since then.  She did call and was told to come to the ED if sx are severe.  She came in late last night and waited all night to be seen.  Pt said the pain has improved a little since she was given ibuprofen 600 mg when she arrived here last night.  She denies fevers, d/c, bleeding.       Home Medications Prior to Admission medications   Medication Sig Start Date End Date Taking? Authorizing Provider  doxycycline (VIBRAMYCIN) 100 MG capsule Take 1 capsule (100 mg total) by mouth 2 (two) times daily. 08/18/22  Yes Jacalyn Lefevre, MD  ibuprofen (ADVIL) 800 MG tablet Take 1 tablet (800 mg total) by mouth every 8 (eight) hours as needed for mild pain. 08/18/22  Yes Jacalyn Lefevre, MD  albuterol (VENTOLIN HFA) 108 (90 Base) MCG/ACT inhaler  12/27/16   [provider]  cetirizine (ZYRTEC) 10 MG tablet TK 1 T PO QD 02/16/18   [provider]  FLUoxetine (PROZAC) 20 MG capsule TK 1 C PO QAM 10/01/18   [provider]  fluticasone (FLONASE) 50 MCG/ACT nasal spray Place into both nostrils. 04/30/22   [provider]  LATUDA 40 MG TABS tablet TK 1 T PO QPM WC 05/12/18   [provider]  norgestimate-ethinyl estradiol (ESTARYLLA) 0.25-35 MG-MCG tablet Take 1 tablet by mouth daily. 04/30/22   Katrinka Blazing, [place], CNM  Oxcarbazepine (TRILEPTAL) 300  MG tablet Take 300 mg by mouth 2 (two) times daily. 06/19/22   [provider]  traZODone (DESYREL) 50 MG tablet Take 50 mg by mouth at bedtime.    [provider]      Allergies    Food and Cat hair extract    Review of Systems   Review of Systems  Genitourinary:  Positive for vaginal pain.  All other systems reviewed and are negative.   Physical Exam Updated Vital Signs BP (!) 141/90   Pulse 71   Temp 98.6 F (37 C) (Oral)   Resp 18   LMP 07/25/2022 (Exact Date)   SpO2 99%  Physical Exam Vitals and nursing note reviewed. Exam conducted with a chaperone present.  Constitutional:      Appearance: Normal appearance.  HENT:     Head: Normocephalic and atraumatic.     Right Ear: External ear normal.     Left Ear: External ear normal.     Nose: Nose normal.     Mouth/Throat:     Mouth: Mucous membranes are moist.     Pharynx: Oropharynx is clear.  Eyes:     Extraocular Movements: Extraocular movements intact.     Conjunctiva/sclera: Conjunctivae normal.     Pupils: Pupils are equal, round, and reactive to light.  Cardiovascular:  Rate and Rhythm: Normal rate and regular rhythm.     Pulses: Normal pulses.     Heart sounds: Normal heart sounds.  Pulmonary:     Effort: Pulmonary effort is normal.     Breath sounds: Normal breath sounds.  Abdominal:     General: Abdomen is flat. Bowel sounds are normal.     Palpations: Abdomen is soft.  Genitourinary:    Exam position: Lithotomy position.     Vagina: Normal.     Cervix: Discharge present.     Uterus: Normal.      Adnexa: Right adnexa normal and left adnexa normal.     Comments: Pt's leep site has some yellowish d/c and the tissue is friable.   Musculoskeletal:     Cervical back: Normal range of motion and neck supple.  Skin:    General: Skin is warm.     Capillary Refill: Capillary refill takes less than 2 seconds.  Neurological:     General: No focal deficit present.     Mental Status: She is  alert and oriented to person, place, and time.  Psychiatric:        Mood and Affect: Mood normal.        Behavior: Behavior normal.     ED Results / Procedures / Treatments   Labs (all labs ordered are listed, but only abnormal results are displayed) Labs Reviewed  WET PREP, GENITAL - Abnormal; Notable for the following components:      Result Value   WBC, Wet Prep HPF POC >=10 (*)    All other components within normal limits  CBC WITH DIFFERENTIAL/PLATELET - Abnormal; Notable for the following components:   HCT 35.6 (*)    All other components within normal limits  BASIC METABOLIC PANEL - Abnormal; Notable for the following components:   Calcium 8.7 (*)    All other components within normal limits  URINALYSIS, ROUTINE W REFLEX MICROSCOPIC  I-STAT BETA HCG BLOOD, ED ([facility], [facility], [facility] ONLY)  GC/CHLAMYDIA PROBE AMP ([place]) NOT AT [facility]    EKG None  Radiology US Pelvis Complete  Result Date: 08/18/2022 CLINICAL DATA:  Vaginal pain. EXAM: TRANSABDOMINAL ULTRASOUND OF PELVIS DOPPLER ULTRASOUND OF OVARIES TECHNIQUE: Transabdominal ultrasound examination of the pelvis was performed including evaluation of the uterus, ovaries, adnexal regions, and pelvic cul-de-sac. Color and duplex Doppler ultrasound was utilized to evaluate blood flow to the ovaries. COMPARISON:  None Available. FINDINGS: Uterus Measurements: 6.9 x 3.7 x 4.7 cm = volume: 62.64 mL. No fibroids or other mass visualized. Endometrium Thickness: 8.54 mm.  No focal abnormality visualized. Right ovary Measurements: 3.0 x 1.7 x 1.7 cm = volume: 4.78 mL. Normal appearance/no adnexal mass. Left ovary Measurements: 3.1 x 2.6 x 1.9 cm = volume: 7.80 mL. Normal appearance/no adnexal mass. Pulsed Doppler evaluation demonstrates normal low-resistance arterial and venous waveforms in both ovaries. Other: No free fluid. A linear hyperechoic structure is noted in the cervix measuring 2.8 x 5.8 mm. IMPRESSION: 1. No evidence of ovarian  torsion. 2. Linear echogenic focus in the cervix. Clinical correlation is recommended. Electronically Signed   By: Thornell Sartorius M.D.   On: 08/18/2022 01:46   [place] Art/Ven Flow Abd Pelv Doppler  Result Date: 08/18/2022 CLINICAL DATA:  Vaginal pain. EXAM: TRANSABDOMINAL ULTRASOUND OF PELVIS DOPPLER ULTRASOUND OF OVARIES TECHNIQUE: Transabdominal ultrasound examination of the pelvis was performed including evaluation of the uterus, ovaries, adnexal regions, and pelvic cul-de-sac. Color and duplex Doppler ultrasound was utilized to evaluate blood flow  to the ovaries. COMPARISON:  None Available. FINDINGS: Uterus Measurements: 6.9 x 3.7 x 4.7 cm = volume: 62.64 mL. No fibroids or other mass visualized. Endometrium Thickness: 8.54 mm.  No focal abnormality visualized. Right ovary Measurements: 3.0 x 1.7 x 1.7 cm = volume: 4.78 mL. Normal appearance/no adnexal mass. Left ovary Measurements: 3.1 x 2.6 x 1.9 cm = volume: 7.80 mL. Normal appearance/no adnexal mass. Pulsed Doppler evaluation demonstrates normal low-resistance arterial and venous waveforms in both ovaries. Other: No free fluid. A linear hyperechoic structure is noted in the cervix measuring 2.8 x 5.8 mm. IMPRESSION: 1. No evidence of ovarian torsion. 2. Linear echogenic focus in the cervix. Clinical correlation is recommended. Electronically Signed   By: Thornell SartoriusLaura  Taylor M.D.   On: 08/18/2022 01:46    Procedures Procedures    Medications Ordered in ED Medications  doxycycline (VIBRA-TABS) tablet 100 mg (has no administration in time range)  ibuprofen (ADVIL) tablet 600 mg (600 mg Oral Given 08/17/22 2135)  sodium chloride 0.9 % bolus 1,000 mL (1,000 mLs Intravenous New Bag/Given 08/18/22 0719)  ketorolac (TORADOL) 30 MG/ML injection 30 mg (30 mg Intravenous Given 08/18/22 0719)    ED Course/ Medical Decision Making/ A&P                           Medical Decision Making Amount and/or Complexity of Data Reviewed Labs:  ordered.  Risk Prescription drug management.   This patient presents to the ED for concern of vaginal pain, this involves an extensive number of treatment options, and is a complaint that carries with it a high risk of complications and morbidity.  The differential diagnosis includes surgical complication, infection   Co morbidities that complicate the patient evaluation  asthma, depression, and ptsd   Additional history obtained:  Additional history obtained from epic chart review  Lab Tests:  I Ordered, and personally interpreted labs.  The pertinent results include:  cbc nl, bmp nl, wet prep neg, preg neg   Imaging Studies ordered:  I ordered imaging studies including US  I independently visualized and interpreted imaging which showed  1. No evidence of ovarian torsion.  2. Linear echogenic focus in the cervix. Clinical correlation is  recommended.   I agree with the radiologist interpretation   Cardiac Monitoring:  The patient was maintained on a cardiac monitor.  I personally viewed and interpreted the cardiac monitored which showed an underlying rhythm of: nsr   Medicines ordered and prescription drug management:  I ordered medication including toradol and ivfs  for pain  Reevaluation of the patient after these medicines showed that the patient improved I have reviewed the patients home medicines and have made adjustments as needed   Test Considered:  us   Consultations Obtained:  I requested consultation with the gyn (Dr. Ladean Rayaonstance),  and discussed lab and imaging findings as well as pertinent plan - she said we can treat for possible endometritis.  Pt is to continue taking ibuprofen.   Problem List / ED Course:  Cervical pain s/p LEEP:  possible infection with yellow d/c.  I will treat with doxy.  Pt is to f/u with gyn.  Return if worse.    Reevaluation:  After the interventions noted above, I reevaluated the patient and found that they have  :improved   Social Determinants of Health:  Lives at home   Dispostion:  After consideration of the diagnostic results and the patients response to treatment, I feel  that the patent would benefit from discharge with outpatient f/u.           Final Clinical Impression(s) / ED Diagnoses Final diagnoses:  History of loop electrical excision procedure (LEEP)  Purulent discharge of cervix    Rx / DC Orders ED Discharge Orders          Ordered    doxycycline (VIBRAMYCIN) 100 MG capsule  2 times daily        08/18/22 0829    ibuprofen (ADVIL) 800 MG tablet  Every 8 hours PRN        08/18/22 0830              Jacalyn Lefevre, MD 08/18/22 0830

## 2022-08-19 LAB — GC/CHLAMYDIA PROBE AMP (~~LOC~~) NOT AT ARMC
Chlamydia: NEGATIVE
Comment: NEGATIVE
Comment: NORMAL
Neisseria Gonorrhea: NEGATIVE

## 2022-08-19 NOTE — Telephone Encounter (Signed)
Returned patients call. She reports she threw up her antibiotics last night. She has not taken this morning, she plans to eat this morning before taking. Advised her to take with food and if still having difficulty to call the office back. Patient voiced understanding.

## 2022-08-25 ENCOUNTER — Other Ambulatory Visit: Payer: Self-pay

## 2022-08-25 ENCOUNTER — Encounter (HOSPITAL_COMMUNITY): Payer: Self-pay | Admitting: Emergency Medicine

## 2022-08-25 ENCOUNTER — Emergency Department (HOSPITAL_COMMUNITY)
Admission: EM | Admit: 2022-08-25 | Discharge: 2022-08-25 | Discharge status: Against Medical Advice | Payer: Medicare Other | Attending: Student | Admitting: Student

## 2022-08-25 DIAGNOSIS — Y760 Diagnostic and monitoring obstetric and gynecological devices associated with adverse incidents: Secondary | ICD-10-CM | POA: Insufficient documentation

## 2022-08-25 DIAGNOSIS — Z5321 Procedure and treatment not carried out due to patient leaving prior to being seen by health care provider: Secondary | ICD-10-CM | POA: Diagnosis not present

## 2022-08-25 DIAGNOSIS — T819XXA Unspecified complication of procedure, initial encounter: Secondary | ICD-10-CM | POA: Diagnosis present

## 2022-08-25 LAB — BASIC METABOLIC PANEL
Anion gap: 10 (ref 5–15)
BUN: 13 mg/dL (ref 6–20)
CO2: 24 mmol/L (ref 22–32)
Calcium: 9.1 mg/dL (ref 8.9–10.3)
Chloride: 105 mmol/L (ref 98–111)
Creatinine, Ser: 0.93 mg/dL (ref 0.44–1.00)
GFR, Estimated: >60 mL/min (ref >60)
Glucose, Bld: 95 mg/dL (ref 70–99)
Potassium: 4.1 mmol/L (ref 3.5–5.1)
Sodium: 139 mmol/L (ref 135–145)

## 2022-08-25 LAB — CBC
HCT: 40.8 % (ref 36.0–46.0)
Hemoglobin: 13.4 g/dL (ref 12.0–15.0)
MCH: 30.5 pg (ref 26.0–34.0)
MCHC: 32.8 g/dL (ref 30.0–36.0)
MCV: 92.9 fL (ref 80.0–100.0)
Platelets: 354 10*3/uL (ref 150–400)
RBC: 4.39 MIL/uL (ref 3.87–5.11)
RDW: 12.3 % (ref 11.5–15.5)
WBC: 6.9 10*3/uL (ref 4.0–10.5)
nRBC: 0 % (ref 0.0–0.2)

## 2022-08-25 NOTE — ED Provider Triage Note (Signed)
Emergency Medicine Provider Triage Evaluation Note  [name] , a 31 y.o. female  was evaluated in triage.  Pt complains of requesting a repeat wet prep.  Patient was seen on approximately November 20 and treated for possible endometritis.  Patient states that she is currently on her period and is having some mild cramping and is unsure if this is due to the previous possible infection or due to her normal cycle.  Patient states that she is unable to see her OB/GYN for another week and request wet prep at the emergency department.  She denies fevers, nausea, vomiting.  Review of Systems  Positive: As above Negative: As above  Physical Exam  LMP 07/25/2022 (Exact Date)  Gen:   Awake, no distress   Resp:  Normal effort  MSK:   Moves extremities without difficulty  Other:    Medical Decision Making  Medically screening exam initiated at 9:20 AM.  Appropriate orders placed.  [name] was informed that the remainder of the evaluation will be completed by another provider, this initial triage assessment does not replace that evaluation, and the importance of remaining in the ED until their evaluation is complete.     Darrick Grinder, [place] 08/25/22 [phone]

## 2022-08-25 NOTE — ED Notes (Signed)
Pt no longer wanted wait

## 2022-08-25 NOTE — ED Triage Notes (Signed)
Pt sates she had a LEEP procedure on 07/27/22. Pt came back for re-eval due to concern for an infection. Pt finished her antibiotics yesterday. Pt states she is on her period at this time, but still having abd cramping. Pt wanting to make sure her abd pain is not due to ongoing infection or post op problems.

## 2022-08-31 ENCOUNTER — Ambulatory Visit: Payer: Medicare Other | Admitting: Physical Therapy

## 2022-09-02 NOTE — Therapy (Signed)
OUTPATIENT PHYSICAL THERAPY LOWER EXTREMITY EVALUATION   Patient Name: [name] MRN: [id number] DOB:[birth year], 31 y.o., female Today's Date: 09/03/2022  END OF SESSION:  PT End of Session - 09/03/22 1138     Visit Number 1    Number of Visits 13    Date for PT Re-Evaluation 10/29/22    Authorization Type Medicare AB    Progress Note Due on Visit 10    PT Start Time 1145    PT Stop Time 1236    PT Time Calculation (min) 51 min    Activity Tolerance Patient tolerated treatment well;No increased pain    Behavior During Therapy WFL for tasks assessed/performed             Past Medical History:  Diagnosis Date   Asthma    Depression    Nerve pain    hx shingles   PTSD (post-traumatic stress disorder)    Shingles 2010   Past Surgical History:  Procedure Laterality Date   INCISE AND DRAIN ABCESS     Patient Active Problem List   Diagnosis Date Noted   Schizoaffective disorder (HCC) 07/27/2022   HGSIL (high grade squamous intraepithelial lesion) on Pap smear of cervix 08/22/2016   DISTURBANCE OF SKIN SENSATION 10/23/2010   BIPOLAR DISORDER UNSPECIFIED 02/25/2010   POST TRAUMATIC STRESS SYNDROME 02/25/2010   ATTENTION DEFICIT DISORDER 02/25/2010   ALLERGIC RHINITIS 02/25/2010   ASTHMA 02/25/2010    PCP: Medicine, [facility]  REFERRING PROVIDER: Donato Schultz, FNP  REFERRING DIAG: [phone] (ICD-10-CM) - Pain in left ankle and joints of left foot  THERAPY DIAG:  Pain in left ankle and joints of left foot  Stiffness of left ankle, not elsewhere classified  Muscle weakness (generalized)  Difficulty in walking, not elsewhere classified  Rationale for Evaluation and Treatment: Rehabilitation  ONSET DATE: September  SUBJECTIVE:   SUBJECTIVE STATEMENT: Pt states she sprained L ankle in September - states she was walking on uneven pavement, rolled her ankle (inversion). Walked on it remainder of day and seemed like it was okay but  then that night she noticed significant swelling. States she wrapped it for a while, seemed like it slowly improved but continues to give her issues. States that she feels she is walking differently, has increased difficulty on uneven surfaces and walking uphill.  Typically pretty active, outdoors activities (spends time in [place]). Increased difficulty with majority of WB activities. Variable swelling that tends to worsen throughout day, denies numbness/tingling. Some burning in posterolateral ankle at times  PERTINENT HISTORY: PMH: PTSD, anxiety/depression, asthma  PAIN:  Are you having pain: none  Location: L ankle, posterolateral How would you describe your pain? stiff Best in past week: 0/10 Worst in past week: 5/10 Aggravating factors: WB, crouching, walking uphill, uneven surfaces Easing factors: rest, ice, medication, elevation   PRECAUTIONS: None  WEIGHT BEARING RESTRICTIONS: No  FALLS:  Has patient fallen in last 6 months? No  LIVING ENVIRONMENT: Half of time spent up in [place]: 3STE 1 level [place]:  room on second floor, 2 flight to get up   OCCUPATION: not working - enjoys walking, taking care of plants   PLOF: Independent  PATIENT GOALS: be able to walk comfortably on different surfaces, be able to bend/crouch  NEXT MD VISIT: none scheduled   OBJECTIVE:   DIAGNOSTIC FINDINGS: unremarkable L ankle XR 05/31/22  PATIENT SURVEYS:  FOTO 59, 74 predicted  COGNITION: Overall cognitive status: Within functional limits for tasks assessed  SENSATION: Light touch grossly intact on exam  EDEMA:  Mild localized edema along posterolateral ankle between lateral malleolus and distal achilles; not formally measured  PALPATION: Mild TTP distal achilles tendon, posterolateral ankle between lateral malleolus and distal achilles. Mild discomfort distal peroneals Fixed calcaneus + forefoot eversion concordant pain  LOWER EXTREMITY MMT:  MMT Right eval  Left eval  Ankle dorsiflexion 5 4+  Ankle plantarflexion 5 4 p!  Ankle inversion 5 4  Ankle eversion 5 4p!   (Blank rows = not tested)  LOWER EXTREMITY MOBILITY:  AROM Right eval Left eval  Ankle dorsiflexion 10 5  Ankle plantarflexion 50 50  Ankle inversion 25 20  Ankle eversion 10 8   (Blank rows = not tested) Comments: Mild discomfort with inversion and dorsiflexion, eversion most uncomfortable  LOWER EXTREMITY SPECIAL TESTS:  No gross ligamentous instability, discomfort with ROM as above  GAIT: Distance walked: within clinic Assistive device utilized: None Level of assistance: Complete Independence Comments: mild antalgic gait on L, reduced step length and cadence   TODAY'S TREATMENT:                                                                                                                              [facility] Adult PT Treatment:                                                DATE: 09/03/22 Therapeutic Exercise: Seated heel/toe raises x10 education for HEP  Seated inv/eversion x10 education for HEP  PATIENT EDUCATION:  Education details: Pt education on PT impairments, prognosis, and POC. Informed consent. Rationale for interventions, safe/appropriate HEP performance Person educated: Patient Education method: Explanation, Demonstration, Tactile cues, Verbal cues, and Handouts Education comprehension: verbalized understanding, returned demonstration, verbal cues required, tactile cues required, and needs further education    HOME EXERCISE PROGRAM: Access Code: [email or web address] URL: [email or web address] Date: 09/03/2022 Prepared by: Fransisco Hertz  Exercises - Seated Heel Toe Raises  - 1 x daily - 7 x weekly - 3 sets - 10 reps - Ankle Inversion Eversion Towel Slide  - 1 x daily - 7 x weekly - 3 sets - 10 reps  ASSESSMENT:  CLINICAL IMPRESSION: Patient is a 31 y.o. woman who was seen today for physical therapy evaluation and treatment for L ankle/foot  pain. Pt reports rolling her ankle in September - has improved somewhat compared to initial onset but continues to deal with swelling/pain, XR unremarkable. On examination pt demonstrates concordant TTP posterolateral ankle with some visible edema, no bony tenderness or gross ligamentous instability. Ankle mobility/strength deficits as above. Pt would likely benefit from skilled PT in order to address these deficits and maximize functional tolerance. Tolerates HEP without increase in resting pain (although some transient symptom increase with heel/toe raises, non worsening) and no adverse events. Pt departs  today's session in no acute distress, all voiced questions/concerns addressed appropriately from PT perspective.    OBJECTIVE IMPAIRMENTS: Abnormal gait, decreased activity tolerance, decreased balance, decreased endurance, decreased mobility, difficulty walking, decreased ROM, decreased strength, hypomobility, increased edema, and pain.   ACTIVITY LIMITATIONS: carrying, lifting, bending, standing, squatting, and locomotion level  PARTICIPATION LIMITATIONS: community activity and yard work  PERSONAL FACTORS: Time since onset of injury/illness/exacerbation are also affecting patient's functional outcome.   REHAB POTENTIAL: Good  CLINICAL DECISION MAKING: Stable/uncomplicated  EVALUATION COMPLEXITY: Low   GOALS: Goals reviewed with patient? Yes  SHORT TERM GOALS: Target date: 09/24/2022   Pt will demonstrate appropriate understanding and performance of initially prescribed HEP in order to facilitate improved independence with management of symptoms.  Baseline: HEP provided on eval Goal status: INITIAL   2. Pt will score greater than or equal to 65 on FOTO in order to demonstrate improved perception of function due to symptoms.  Baseline: 59  Goal status: INITIAL   LONG TERM GOALS: Target date: 10/15/2022   Pt will score 74 or greater on FOTO in order to demonstrate improved perception  of function due to symptoms.  Baseline: 59 Goal status: INITIAL  2.  Pt will demonstrate at least 10 degrees of L ankle DF AROM in order to facilitate improved tolerance to functional movements such as squatting/walking.  Baseline: see ROM chart above Goal status: INITIAL  3.  Pt will demonstrate 5/5 L ankle MMT in order to demonstrate improved strength for functional WB. Baseline: see MMT chart above Goal status: INITIAL  4.  Pt will report/demonstrate ability to stand/walk for up to on even/uneven surfaces with less than 2/10 pain on NPS in order to facilitate improved tolerance to community ambulation.  Baseline: 5/10 pain with prolonged WB/ambulation, difficulty with uneven surfaces Goal status: INITIA  5. Pt will demonstrate ability to perform full squat/crouch with less than 2/10 pain on NPS in order to promote tolerance to household/recreational activities.  Baseline: Pain w/ crouching, up to 5/10 reported Goal status: INITIAL   PLAN:  PT FREQUENCY: 2x/week  PT DURATION: 6 weeks  PLANNED INTERVENTIONS: Therapeutic exercises, Therapeutic activity, Neuromuscular re-education, Balance training, Gait training, Patient/Family education, Self Care, Joint mobilization, Joint manipulation, Stair training, Aquatic Therapy, Dry Needling, Electrical stimulation, Cryotherapy, Moist heat, Taping, Manual therapy, and Re-evaluation  PLAN FOR NEXT SESSION: Progress ROM/strengthening exercises as able/appropriate, review HEP.    Ashley Murrain PT, DPT 09/03/2022 12:45 PM

## 2022-09-03 ENCOUNTER — Ambulatory Visit: Payer: Medicare Other | Attending: Family | Admitting: Physical Therapy

## 2022-09-03 ENCOUNTER — Other Ambulatory Visit: Payer: Self-pay

## 2022-09-03 ENCOUNTER — Encounter: Payer: Self-pay | Admitting: Physical Therapy

## 2022-09-03 DIAGNOSIS — M6281 Muscle weakness (generalized): Secondary | ICD-10-CM | POA: Insufficient documentation

## 2022-09-03 DIAGNOSIS — M25572 Pain in left ankle and joints of left foot: Secondary | ICD-10-CM | POA: Insufficient documentation

## 2022-09-03 DIAGNOSIS — M25672 Stiffness of left ankle, not elsewhere classified: Secondary | ICD-10-CM | POA: Diagnosis present

## 2022-09-03 DIAGNOSIS — R262 Difficulty in walking, not elsewhere classified: Secondary | ICD-10-CM | POA: Diagnosis present

## 2022-09-03 NOTE — Progress Notes (Signed)
GYNECOLOGY OFFICE VISIT NOTE  History:   [name] is a 31 y.o. G0P0 here today for postop check from LEEP. She went to the ER about one month postop with pain but has been fine since. She had an [place] which was normal at that time.   2023: HSIL > LEEP with positive margins.  2021: ASC-H + HR HPV->Rec Colpo, refused due to pain with previous Colpo. 2019: Nml +HR HPV 2018: HSIL + HPV->Rec Colpo Not completed? 2017: Colpo CIN 1     Past Medical History:  Diagnosis Date   Asthma    Depression    Nerve pain    hx shingles   PTSD (post-traumatic stress disorder)    Shingles 2010    Past Surgical History:  Procedure Laterality Date   INCISE AND DRAIN ABCESS      The following portions of the patient's history were reviewed and updated as appropriate: allergies, current medications, past family history, past medical history, past social history, past surgical history and problem list.   Health Maintenance:   Diagnosis  Date Value Ref Range Status  04/28/2022 (A)  Final   - High grade squamous intraepithelial lesion (HSIL)     Review of Systems:  Pertinent items noted in HPI and remainder of comprehensive ROS otherwise negative.  Physical Exam:  BP 129/87   Pulse 94   Wt 211 lb 3.2 oz (95.8 kg)   LMP 08/21/2022 (Exact Date)   BMI 31.19 kg/m  CONSTITUTIONAL: Well-developed, well-nourished female in no acute distress.  HEENT:  Normocephalic, atraumatic. External right and left ear normal. No scleral icterus.  NECK: Normal range of motion, supple, no masses noted on observation SKIN: No rash noted. Not diaphoretic. No erythema. No pallor. MUSCULOSKELETAL: Normal range of motion. No edema noted. NEUROLOGIC: Alert and oriented to person, place, and time. Normal muscle tone coordination. No cranial nerve deficit noted. PSYCHIATRIC: Normal mood and affect. Normal behavior. Normal judgment and thought content.  CARDIOVASCULAR: Normal heart rate noted RESPIRATORY:  Effort and breath sounds normal, no problems with respiration noted ABDOMEN: No masses noted. No other overt distention noted.    PELVIC: Normal appearing external genitalia; normal urethral meatus; normal appearing vaginal mucosa and cervix.  No abnormal discharge noted.  Normal uterine size, no other palpable masses, no uterine or adnexal tenderness. Performed in the presence of a chaperone  Labs and Imaging No results found for this or any previous visit (from the past 168 hour(s)). US Pelvis Complete  Result Date: 08/18/2022 CLINICAL DATA:  Vaginal pain. EXAM: TRANSABDOMINAL ULTRASOUND OF PELVIS DOPPLER ULTRASOUND OF OVARIES TECHNIQUE: Transabdominal ultrasound examination of the pelvis was performed including evaluation of the uterus, ovaries, adnexal regions, and pelvic cul-de-sac. Color and duplex Doppler ultrasound was utilized to evaluate blood flow to the ovaries. COMPARISON:  None Available. FINDINGS: Uterus Measurements: 6.9 x 3.7 x 4.7 cm = volume: 62.64 mL. No fibroids or other mass visualized. Endometrium Thickness: 8.54 mm.  No focal abnormality visualized. Right ovary Measurements: 3.0 x 1.7 x 1.7 cm = volume: 4.78 mL. Normal appearance/no adnexal mass. Left ovary Measurements: 3.1 x 2.6 x 1.9 cm = volume: 7.80 mL. Normal appearance/no adnexal mass. Pulsed Doppler evaluation demonstrates normal low-resistance arterial and venous waveforms in both ovaries. Other: No free fluid. A linear hyperechoic structure is noted in the cervix measuring 2.8 x 5.8 mm. IMPRESSION: 1. No evidence of ovarian torsion. 2. Linear echogenic focus in the cervix. Clinical correlation is recommended. Electronically Signed   By:  Thornell Sartorius M.D.   On: 08/18/2022 01:46   [place] Art/Ven Flow Abd Pelv Doppler  Result Date: 08/18/2022 CLINICAL DATA:  Vaginal pain. EXAM: TRANSABDOMINAL ULTRASOUND OF PELVIS DOPPLER ULTRASOUND OF OVARIES TECHNIQUE: Transabdominal ultrasound examination of the pelvis was performed  including evaluation of the uterus, ovaries, adnexal regions, and pelvic cul-de-sac. Color and duplex Doppler ultrasound was utilized to evaluate blood flow to the ovaries. COMPARISON:  None Available. FINDINGS: Uterus Measurements: 6.9 x 3.7 x 4.7 cm = volume: 62.64 mL. No fibroids or other mass visualized. Endometrium Thickness: 8.54 mm.  No focal abnormality visualized. Right ovary Measurements: 3.0 x 1.7 x 1.7 cm = volume: 4.78 mL. Normal appearance/no adnexal mass. Left ovary Measurements: 3.1 x 2.6 x 1.9 cm = volume: 7.80 mL. Normal appearance/no adnexal mass. Pulsed Doppler evaluation demonstrates normal low-resistance arterial and venous waveforms in both ovaries. Other: No free fluid. A linear hyperechoic structure is noted in the cervix measuring 2.8 x 5.8 mm. IMPRESSION: 1. No evidence of ovarian torsion. 2. Linear echogenic focus in the cervix. Clinical correlation is recommended. Electronically Signed   By: Thornell Sartorius M.D.   On: 08/18/2022 01:46    Assessment and Plan:   1. Postop check No restrictions  2. HGSIL (high grade squamous intraepithelial lesion) on Pap smear of cervix With positive margins, recommend pap and ECC in 4 months.  S/p Gardasil   [name] was seen today for follow-up.  Diagnoses and all orders for this visit:  Postop check  HGSIL (high grade squamous intraepithelial lesion) on Pap smear of cervix    Routine preventative health maintenance measures emphasized. Please refer to After Visit Summary for other counseling recommendations.   Return in about 4 months (around 01/04/2023), or pap and ECC.  Milas Hock, MD, FACOG Obstetrician & Gynecologist, [facility] for [facility], [facility]

## 2022-09-04 ENCOUNTER — Ambulatory Visit (INDEPENDENT_AMBULATORY_CARE_PROVIDER_SITE_OTHER): Payer: Medicare Other | Admitting: Obstetrics and Gynecology

## 2022-09-04 ENCOUNTER — Encounter: Payer: Self-pay | Admitting: Obstetrics and Gynecology

## 2022-09-04 VITALS — BP 129/87 | HR 94 | Wt 211.2 lb

## 2022-09-04 DIAGNOSIS — R87613 High grade squamous intraepithelial lesion on cytologic smear of cervix (HGSIL): Secondary | ICD-10-CM

## 2022-09-04 DIAGNOSIS — Z09 Encounter for follow-up examination after completed treatment for conditions other than malignant neoplasm: Secondary | ICD-10-CM

## 2022-09-04 NOTE — Therapy (Signed)
OUTPATIENT PHYSICAL THERAPY TREATMENT NOTE   Patient Name: [name] MRN: [id number] DOB:1991-07-24, 31 y.o., female Today's Date: 09/04/2022  PCP: Medicine, [facility]   REFERRING PROVIDER: Donato Schultz, FNP  END OF SESSION:    Past Medical History:  Diagnosis Date   Asthma    Depression    Nerve pain    hx shingles   PTSD (post-traumatic stress disorder)    Shingles 2010   Past Surgical History:  Procedure Laterality Date   INCISE AND DRAIN ABCESS     Patient Active Problem List   Diagnosis Date Noted   Schizoaffective disorder (HCC) 07/27/2022   HGSIL (high grade squamous intraepithelial lesion) on Pap smear of cervix 08/22/2016   DISTURBANCE OF SKIN SENSATION 10/23/2010   BIPOLAR DISORDER UNSPECIFIED 02/25/2010   POST TRAUMATIC STRESS SYNDROME 02/25/2010   ATTENTION DEFICIT DISORDER 02/25/2010   ALLERGIC RHINITIS 02/25/2010   ASTHMA 02/25/2010    REFERRING DIAG: M25.572 (ICD-10-CM) - Pain in left ankle and joints of left foot   THERAPY DIAG:  No diagnosis found.  Rationale for Evaluation and Treatment Rehabilitation  PERTINENT HISTORY: PTSD, anxiety/depression, asthma; rolled ankle September 2023  PRECAUTIONS: none  SUBJECTIVE:                                                                                                                                                                                      SUBJECTIVE STATEMENT:  ***   PAIN:   Are you having pain: none  Location: L ankle, posterolateral How would you describe your pain? stiff Best in past week: 0/10 Worst in past week: 5/10 Aggravating factors: WB, crouching, walking uphill, uneven surfaces Easing factors: rest, ice, medication, elevation    OBJECTIVE: (objective measures completed at initial evaluation unless otherwise dated)   DIAGNOSTIC FINDINGS: unremarkable L ankle XR 05/31/22   PATIENT SURVEYS:  FOTO 59, 74 predicted   COGNITION: Overall  cognitive status: Within functional limits for tasks assessed                         SENSATION: Light touch grossly intact on exam   EDEMA:  Mild localized edema along posterolateral ankle between lateral malleolus and distal achilles; not formally measured   PALPATION: Mild TTP distal achilles tendon, posterolateral ankle between lateral malleolus and distal achilles. Mild discomfort distal peroneals Fixed calcaneus + forefoot eversion concordant pain   LOWER EXTREMITY MMT:   MMT Right eval Left eval  Ankle dorsiflexion 5 4+  Ankle plantarflexion 5 4 p!  Ankle inversion 5 4  Ankle eversion 5 4p!   (Blank rows = not tested)  LOWER EXTREMITY MOBILITY:   AROM Right eval Left eval  Ankle dorsiflexion 10 5  Ankle plantarflexion 50 50  Ankle inversion 25 20  Ankle eversion 10 8   (Blank rows = not tested) Comments: Mild discomfort with inversion and dorsiflexion, eversion most uncomfortable   LOWER EXTREMITY SPECIAL TESTS:  No gross ligamentous instability, discomfort with ROM as above   GAIT: Distance walked: within clinic Assistive device utilized: None Level of assistance: Complete Independence Comments: mild antalgic gait on L, reduced step length and cadence     TODAY'S TREATMENT:                                                                                                   [facility] Adult PT Treatment:                                                DATE: 09/07/22 Therapeutic Exercise: *** Manual Therapy: *** Neuromuscular re-ed: *** Therapeutic Activity: *** Modalities: *** Self Care: ***                            [facility] Adult PT Treatment:                                                DATE: 09/03/22 Therapeutic Exercise: Seated heel/toe raises x10 education for HEP  Seated inv/eversion x10 education for HEP   PATIENT EDUCATION:  Education details: Pt education on PT impairments, prognosis, and POC. Informed consent. Rationale for interventions,  safe/appropriate HEP performance Person educated: Patient Education method: Explanation, Demonstration, Tactile cues, Verbal cues, and Handouts Education comprehension: verbalized understanding, returned demonstration, verbal cues required, tactile cues required, and needs further education     HOME EXERCISE PROGRAM: Access Code: [email or web address] URL: [email or web address] Date: 09/03/2022 Prepared by: Fransisco Hertz   Exercises - Seated Heel Toe Raises  - 1 x daily - 7 x weekly - 3 sets - 10 reps - Ankle Inversion Eversion Towel Slide  - 1 x daily - 7 x weekly - 3 sets - 10 reps   ASSESSMENT:   CLINICAL IMPRESSION: 09/04/2022 ***  Eval: Patient is a 31 y.o. woman who was seen today for physical therapy evaluation and treatment for L ankle/foot pain. Pt reports rolling her ankle in September - has improved somewhat compared to initial onset but continues to deal with swelling/pain, XR unremarkable. On examination pt demonstrates concordant TTP posterolateral ankle with some visible edema, no bony tenderness or gross ligamentous instability. Ankle mobility/strength deficits as above. Pt would likely benefit from skilled PT in order to address these deficits and maximize functional tolerance. Tolerates HEP without increase in resting pain (although some transient symptom increase with heel/toe raises, non worsening) and no adverse events. Pt departs today's session in no acute distress, all  voiced questions/concerns addressed appropriately from PT perspective.     OBJECTIVE IMPAIRMENTS: Abnormal gait, decreased activity tolerance, decreased balance, decreased endurance, decreased mobility, difficulty walking, decreased ROM, decreased strength, hypomobility, increased edema, and pain.    ACTIVITY LIMITATIONS: carrying, lifting, bending, standing, squatting, and locomotion level   PARTICIPATION LIMITATIONS: community activity and yard work   PERSONAL FACTORS: Time since onset of  injury/illness/exacerbation are also affecting patient's functional outcome.    REHAB POTENTIAL: Good   CLINICAL DECISION MAKING: Stable/uncomplicated   EVALUATION COMPLEXITY: Low     GOALS: Goals reviewed with patient? Yes   SHORT TERM GOALS: Target date: 09/24/2022   Pt will demonstrate appropriate understanding and performance of initially prescribed HEP in order to facilitate improved independence with management of symptoms.  Baseline: HEP provided on eval Goal status: INITIAL    2. Pt will score greater than or equal to 65 on FOTO in order to demonstrate improved perception of function due to symptoms.            Baseline: 59            Goal status: INITIAL    LONG TERM GOALS: Target date: 10/15/2022    Pt will score 74 or greater on FOTO in order to demonstrate improved perception of function due to symptoms.  Baseline: 59 Goal status: INITIAL   2.  Pt will demonstrate at least 10 degrees of L ankle DF AROM in order to facilitate improved tolerance to functional movements such as squatting/walking.  Baseline: see ROM chart above Goal status: INITIAL   3.  Pt will demonstrate 5/5 L ankle MMT in order to demonstrate improved strength for functional WB. Baseline: see MMT chart above Goal status: INITIAL   4.  Pt will report/demonstrate ability to stand/walk for up to on even/uneven surfaces with less than 2/10 pain on NPS in order to facilitate improved tolerance to community ambulation.  Baseline: 5/10 pain with prolonged WB/ambulation, difficulty with uneven surfaces Goal status: INITIA   5. Pt will demonstrate ability to perform full squat/crouch with less than 2/10 pain on NPS in order to promote tolerance to household/recreational activities.  Baseline: Pain w/ crouching, up to 5/10 reported Goal status: INITIAL     PLAN:   PT FREQUENCY: 2x/week   PT DURATION: 6 weeks   PLANNED INTERVENTIONS: Therapeutic exercises, Therapeutic activity, Neuromuscular  re-education, Balance training, Gait training, Patient/Family education, Self Care, Joint mobilization, Joint manipulation, Stair training, Aquatic Therapy, Dry Needling, Electrical stimulation, Cryotherapy, Moist heat, Taping, Manual therapy, and Re-evaluation   PLAN FOR NEXT SESSION: Progress ROM/strengthening exercises as able/appropriate, review HEP.   Ashley Murrain PT, DPT 09/04/2022 8:33 AM

## 2022-09-07 ENCOUNTER — Ambulatory Visit: Payer: Medicare Other | Admitting: Physical Therapy

## 2022-09-07 ENCOUNTER — Encounter: Payer: Self-pay | Admitting: Physical Therapy

## 2022-09-07 DIAGNOSIS — M6281 Muscle weakness (generalized): Secondary | ICD-10-CM

## 2022-09-07 DIAGNOSIS — M25672 Stiffness of left ankle, not elsewhere classified: Secondary | ICD-10-CM

## 2022-09-07 DIAGNOSIS — M25572 Pain in left ankle and joints of left foot: Secondary | ICD-10-CM | POA: Diagnosis not present

## 2022-09-07 DIAGNOSIS — R262 Difficulty in walking, not elsewhere classified: Secondary | ICD-10-CM

## 2022-09-09 NOTE — Therapy (Signed)
OUTPATIENT PHYSICAL THERAPY TREATMENT NOTE   Patient Name: [name] MRN: [id number] DOB:09-06-1991, 31 y.o., female Today's Date: 09/10/2022  PCP: Medicine, [facility]   REFERRING PROVIDER: Donato Schultz, FNP  END OF SESSION:   PT End of Session - 09/10/22 1422     Visit Number 3    Number of Visits 13    Date for PT Re-Evaluation 10/29/22    Authorization Type Medicare AB    Progress Note Due on Visit 10    PT Start Time 1422   pt late arrival   PT Stop Time 1500    PT Time Calculation (min) 38 min    Activity Tolerance No increased pain;Patient tolerated treatment well    Behavior During Therapy [facility] for tasks assessed/performed              Past Medical History:  Diagnosis Date   Asthma    Depression    Nerve pain    hx shingles   PTSD (post-traumatic stress disorder)    Shingles 2010   Past Surgical History:  Procedure Laterality Date   INCISE AND DRAIN ABCESS     Patient Active Problem List   Diagnosis Date Noted   Schizoaffective disorder (HCC) 07/27/2022   HGSIL (high grade squamous intraepithelial lesion) on Pap smear of cervix 08/22/2016   DISTURBANCE OF SKIN SENSATION 10/23/2010   BIPOLAR DISORDER UNSPECIFIED 02/25/2010   POST TRAUMATIC STRESS SYNDROME 02/25/2010   ATTENTION DEFICIT DISORDER 02/25/2010   ALLERGIC RHINITIS 02/25/2010   ASTHMA 02/25/2010    REFERRING DIAG: M25.572 (ICD-10-CM) - Pain in left ankle and joints of left foot   THERAPY DIAG:  Pain in left ankle and joints of left foot  Stiffness of left ankle, not elsewhere classified  Muscle weakness (generalized)  Difficulty in walking, not elsewhere classified  Rationale for Evaluation and Treatment Rehabilitation  PERTINENT HISTORY: PTSD, anxiety/depression, asthma; rolled ankle September 2023  PRECAUTIONS: none  SUBJECTIVE:                                                                                                                                                                                       SUBJECTIVE STATEMENT:   Pt arrives and states she feels a bit better after last session compared to the previous session although did have some soreness, continues to have intermittent pain swelling. Notes she has combined elevation and ice which has seemed helpful. No issues w/ HEP    PAIN:   Are you having pain: none  Location: L ankle, posterolateral How would you describe your pain? stiff Best in past week: 0/10 Worst in past week: 5/10 Aggravating  factors: WB, crouching, walking uphill, uneven surfaces Easing factors: rest, ice, medication, elevation    OBJECTIVE: (objective measures completed at initial evaluation unless otherwise dated)   DIAGNOSTIC FINDINGS: unremarkable L ankle XR 05/31/22   PATIENT SURVEYS:  FOTO 59, 74 predicted   COGNITION: Overall cognitive status: Within functional limits for tasks assessed                         SENSATION: Light touch grossly intact on exam   EDEMA:  Mild localized edema along posterolateral ankle between lateral malleolus and distal achilles; not formally measured   PALPATION: Mild TTP distal achilles tendon, posterolateral ankle between lateral malleolus and distal achilles. Mild discomfort distal peroneals Fixed calcaneus + forefoot eversion concordant pain   LOWER EXTREMITY MMT:   MMT Right eval Left eval  Ankle dorsiflexion 5 4+  Ankle plantarflexion 5 4 p!  Ankle inversion 5 4  Ankle eversion 5 4p!   (Blank rows = not tested)   LOWER EXTREMITY MOBILITY:   AROM Right eval Left eval  Ankle dorsiflexion 10 5  Ankle plantarflexion 50 50  Ankle inversion 25 20  Ankle eversion 10 8   (Blank rows = not tested) Comments: Mild discomfort with inversion and dorsiflexion, eversion most uncomfortable   LOWER EXTREMITY SPECIAL TESTS:  No gross ligamentous instability, discomfort with ROM as above   GAIT: Distance walked: within clinic Assistive  device utilized: None Level of assistance: Complete Independence Comments: mild antalgic gait on L, reduced step length and cadence     TODAY'S TREATMENT:                                                                                                    [facility] Adult PT Treatment:                                                DATE: 09/10/22 Therapeutic Exercise: Seated inversion/eversion 3x10 cues for reduced compensation at knee 4inch step up 2x8 cues for control and pacing Airex kickstand step up RLE leading 2x8 HEP review  Neuromuscular re-ed: Toe scrunches x20, seated, tactile/verbal cues as needed  faded feedback Toe splays x20, tactile/verbal cues as needed  cues for tripod foot  Post tib isos 2x10 cues for form, ball between heels, cues for pacing  [facility] Adult PT Treatment:                                                DATE: 09/07/22 Therapeutic Exercise: Seated DF 2x10 cues for form Seated inversion/eversion 2x10 cues for comfortable ROM Long sitting AP x10 cues for pacing and ROM Standing DF stretch w/ slant board 3x30sec cues for appropriate   Neuromuscular re-ed: Isolated hallux extension x20, tactile/verbal cues as needed to mitigate compensations, cues for tripod foot  Toe scrunches x20 tactile/verbal cues as needed, cues for tripod foot Seated toe splays x20 cues for form and tripod foot                        [facility] Adult PT Treatment:                                                DATE: 09/03/22 Therapeutic Exercise: Seated heel/toe raises x10 education for HEP  Seated inv/eversion x10 education for HEP   PATIENT EDUCATION:  Education details: Reviewed HEP, rationale for interventions, relevant anatomy/physiology Person educated: Patient Education method: Explanation, Demonstration, Tactile cues, Verbal cues Education comprehension: verbalized understanding, returned demonstration, verbal cues required, tactile cues required, and needs further education     HOME  EXERCISE PROGRAM: Access Code: [email or web address] URL: [email or web address] Date: 09/03/2022 Prepared by: Fransisco Hertzavid Brittay Mogle   Exercises - Seated Heel Toe Raises  - 1 x daily - 7 x weekly - 3 sets - 10 reps - Ankle Inversion Eversion Towel Slide  - 1 x daily - 7 x weekly - 3 sets - 10 reps   ASSESSMENT:   CLINICAL IMPRESSION: 09/10/2022 Pt arrives with report of minimal pain at present, reports feeling a bit better after last session compared to previous session although continues to have fluctuating swelling/pain. Today introduced closed chain activities on compliant and solid surfaces with good tolerance, no increase in pain. Performed familiar program with reduced rest breaks. Denies any increase in overt pain as session goes on, no adverse events, does endorse some local fatigue with new exercises. Requires rest breaks due to muscular fatigue. Pt departs today's session in no acute distress, all voiced questions/concerns addressed appropriately from PT perspective.    Eval: Patient is a 31 y.o. woman who was seen today for physical therapy evaluation and treatment for L ankle/foot pain. Pt reports rolling her ankle in September - has improved somewhat compared to initial onset but continues to deal with swelling/pain, XR unremarkable. On examination pt demonstrates concordant TTP posterolateral ankle with some visible edema, no bony tenderness or gross ligamentous instability. Ankle mobility/strength deficits as above. Pt would likely benefit from skilled PT in order to address these deficits and maximize functional tolerance. Tolerates HEP without increase in resting pain (although some transient symptom increase with heel/toe raises, non worsening) and no adverse events. Pt departs today's session in no acute distress, all voiced questions/concerns addressed appropriately from PT perspective.     OBJECTIVE IMPAIRMENTS: Abnormal gait, decreased activity tolerance, decreased balance, decreased  endurance, decreased mobility, difficulty walking, decreased ROM, decreased strength, hypomobility, increased edema, and pain.    ACTIVITY LIMITATIONS: carrying, lifting, bending, standing, squatting, and locomotion level   PARTICIPATION LIMITATIONS: community activity and yard work   PERSONAL FACTORS: Time since onset of injury/illness/exacerbation are also affecting patient's functional outcome.    REHAB POTENTIAL: Good   CLINICAL DECISION MAKING: Stable/uncomplicated   EVALUATION COMPLEXITY: Low     GOALS: Goals reviewed with patient? Yes   SHORT TERM GOALS: Target date: 09/24/2022   Pt will demonstrate appropriate understanding and performance of initially prescribed HEP in order to facilitate improved independence with management of symptoms.  Baseline: HEP provided on eval Goal status: INITIAL    2. Pt will score greater than or equal to 65 on FOTO in order to demonstrate improved perception of  function due to symptoms.            Baseline: 59            Goal status: INITIAL    LONG TERM GOALS: Target date: 10/15/2022    Pt will score 74 or greater on FOTO in order to demonstrate improved perception of function due to symptoms.  Baseline: 59 Goal status: INITIAL   2.  Pt will demonstrate at least 10 degrees of L ankle DF AROM in order to facilitate improved tolerance to functional movements such as squatting/walking.  Baseline: see ROM chart above Goal status: INITIAL   3.  Pt will demonstrate 5/5 L ankle MMT in order to demonstrate improved strength for functional WB. Baseline: see MMT chart above Goal status: INITIAL   4.  Pt will report/demonstrate ability to stand/walk for up to on even/uneven surfaces with less than 2/10 pain on NPS in order to facilitate improved tolerance to community ambulation.  Baseline: 5/10 pain with prolonged WB/ambulation, difficulty with uneven surfaces Goal status: INITIA   5. Pt will demonstrate ability to perform full  squat/crouch with less than 2/10 pain on NPS in order to promote tolerance to household/recreational activities.  Baseline: Pain w/ crouching, up to 5/10 reported Goal status: INITIAL     PLAN:   PT FREQUENCY: 2x/week   PT DURATION: 6 weeks   PLANNED INTERVENTIONS: Therapeutic exercises, Therapeutic activity, Neuromuscular re-education, Balance training, Gait training, Patient/Family education, Self Care, Joint mobilization, Joint manipulation, Stair training, Aquatic Therapy, Dry Needling, Electrical stimulation, Cryotherapy, Moist heat, Taping, Manual therapy, and Re-evaluation   PLAN FOR NEXT SESSION:  Review/update HEP. Incorporate more functional movements (STS, step ups)   Ashley Murrain PT, DPT 09/10/2022 5:20 PM

## 2022-09-10 ENCOUNTER — Ambulatory Visit: Payer: Medicare Other | Admitting: Physical Therapy

## 2022-09-10 ENCOUNTER — Encounter: Payer: Self-pay | Admitting: Physical Therapy

## 2022-09-10 DIAGNOSIS — M25572 Pain in left ankle and joints of left foot: Secondary | ICD-10-CM | POA: Diagnosis not present

## 2022-09-10 DIAGNOSIS — R262 Difficulty in walking, not elsewhere classified: Secondary | ICD-10-CM

## 2022-09-10 DIAGNOSIS — M6281 Muscle weakness (generalized): Secondary | ICD-10-CM

## 2022-09-10 DIAGNOSIS — M25672 Stiffness of left ankle, not elsewhere classified: Secondary | ICD-10-CM

## 2022-09-11 NOTE — Therapy (Signed)
OUTPATIENT PHYSICAL THERAPY TREATMENT NOTE   Patient Name: Madison GoslingChandelle M Halk MRN: [id number] DOB:1990/10/29, 31 y.o., female Today's Date: 09/14/2022  PCP: [facility], [facility]   REFERRING PROVIDER: Donato SchultzLucas, Tiffany N, FNP  END OF SESSION:   PT End of Session - 09/14/22 1423     Visit Number 4    Number of Visits 13    Date for PT Re-Evaluation 10/29/22    Authorization Type Medicare AB    Progress Note Due on Visit 10    PT Start Time 1424   pt late arrival   PT Stop Time 1500    PT Time Calculation (min) 36 min    Activity Tolerance No increased pain;Patient tolerated treatment well    Behavior During Therapy [facility] for tasks assessed/performed               Past Medical History:  Diagnosis Date   Asthma    Depression    Nerve pain    hx shingles   PTSD (post-traumatic stress disorder)    Shingles 2010   Past Surgical History:  Procedure Laterality Date   INCISE AND DRAIN ABCESS     Patient Active Problem List   Diagnosis Date Noted   Schizoaffective disorder (HCC) 07/27/2022   HGSIL (high grade squamous intraepithelial lesion) on Pap smear of cervix 08/22/2016   DISTURBANCE OF SKIN SENSATION 10/23/2010   BIPOLAR DISORDER UNSPECIFIED 02/25/2010   POST TRAUMATIC STRESS SYNDROME 02/25/2010   ATTENTION DEFICIT DISORDER 02/25/2010   ALLERGIC RHINITIS 02/25/2010   ASTHMA 02/25/2010    REFERRING DIAG: M25.572 (ICD-10-CM) - Pain in left ankle and joints of left foot   THERAPY DIAG:  Pain in left ankle and joints of left foot  Stiffness of left ankle, not elsewhere classified  Muscle weakness (generalized)  Difficulty in walking, not elsewhere classified  Rationale for Evaluation and Treatment Rehabilitation  PERTINENT HISTORY: PTSD, anxiety/depression, asthma; rolled ankle September 2023  PRECAUTIONS: none  SUBJECTIVE:                                                                                                                                                                                       SUBJECTIVE STATEMENT:   Pt states that her foot/ankle has been doing well since last session, less swollen last night after daily activity compared to normal. No pain at present. Did have some soreness in peroneals after last session. Otherwise no new updates    PAIN:   Are you having pain: none  Location: L ankle, posterolateral How would you describe your pain? stiff Best in past week: 0/10 Worst in past week: 5/10 Aggravating factors: WB, crouching,  walking uphill, uneven surfaces Easing factors: rest, ice, medication, elevation    OBJECTIVE: (objective measures completed at initial evaluation unless otherwise dated)   DIAGNOSTIC FINDINGS: unremarkable L ankle XR 05/31/22   PATIENT SURVEYS:  FOTO 59, 74 predicted   COGNITION: Overall cognitive status: Within functional limits for tasks assessed                         SENSATION: Light touch grossly intact on exam   EDEMA:  Mild localized edema along posterolateral ankle between lateral malleolus and distal achilles; not formally measured   PALPATION: Mild TTP distal achilles tendon, posterolateral ankle between lateral malleolus and distal achilles. Mild discomfort distal peroneals Fixed calcaneus + forefoot eversion concordant pain   LOWER EXTREMITY MMT:   MMT Right eval Left eval  Ankle dorsiflexion 5 4+  Ankle plantarflexion 5 4 p!  Ankle inversion 5 4  Ankle eversion 5 4p!   (Blank rows = not tested)   LOWER EXTREMITY MOBILITY:   AROM Right eval Left eval  Ankle dorsiflexion 10 5  Ankle plantarflexion 50 50  Ankle inversion 25 20  Ankle eversion 10 8   (Blank rows = not tested) Comments: Mild discomfort with inversion and dorsiflexion, eversion most uncomfortable   LOWER EXTREMITY SPECIAL TESTS:  No gross ligamentous instability, discomfort with ROM as above   GAIT: Distance walked: within clinic Assistive device utilized:  None Level of assistance: Complete Independence Comments: mild antalgic gait on L, reduced step length and cadence     TODAY'S TREATMENT:                                                                                                    [facility] Adult PT Treatment:                                                DATE: 09/14/22 Therapeutic Exercise: RTB plantarflexion 2x8 seated cues for form and setup HEP review/update  Neuromuscular re-ed: Toe scrunches WB approx through thigh 2x10 Post tib isos x20 Toe splays WB approx through thigh 2x10   [facility] Adult PT Treatment:                                                DATE: 09/10/22 Therapeutic Exercise: Seated inversion/eversion 3x10 cues for reduced compensation at knee 4inch step up 2x8 cues for control and pacing Airex kickstand step up RLE leading 2x8 HEP review  Neuromuscular re-ed: Toe scrunches x20, seated, tactile/verbal cues as needed  faded feedback Toe splays x20, tactile/verbal cues as needed  cues for tripod foot  Post tib isos 2x10 cues for form, ball between heels, cues for pacing  [facility] Adult PT Treatment:  DATE: 09/07/22 Therapeutic Exercise: Seated DF 2x10 cues for form Seated inversion/eversion 2x10 cues for comfortable ROM Long sitting AP x10 cues for pacing and ROM Standing DF stretch w/ slant board 3x30sec cues for appropriate   Neuromuscular re-ed: Isolated hallux extension x20, tactile/verbal cues as needed to mitigate compensations, cues for tripod foot Toe scrunches x20 tactile/verbal cues as needed, cues for tripod foot Seated toe splays x20 cues for form and tripod foot    PATIENT EDUCATION:  Education details: Reviewed/updated HEP, rationale for interventions, relevant anatomy/physiology Person educated: Patient Education method: Explanation, Demonstration, Tactile cues, Verbal cues Education comprehension: verbalized understanding, returned demonstration,  verbal cues required, tactile cues required, and needs further education     HOME EXERCISE PROGRAM: Access Code: [email or web address] URL: [email or web address] Date: 09/14/2022 Prepared by: Fransisco Hertz  Exercises - Ankle Inversion Eversion Towel Slide  - 1 x daily - 7 x weekly - 3 sets - 10 reps - Seated Toe Towel Scrunches  - 1 x daily - 7 x weekly - 2 sets - 10 reps - Toe Yoga - Alternating Great Toe and Lesser Toe Extension  - 1 x daily - 7 x weekly - 2 sets - 10 reps   ASSESSMENT:   CLINICAL IMPRESSION: 09/14/2022 Pt arrives without pain, denies any significant issues after last session and notes that swelling has seemed a bit better. Session shortened d/t late arrival but pt able to progress for partial WB with foot intrinsic exercises and addition of theraband resisted plantarflexion which has historically been most provocative movement for pt. Pt tolerates well, some initial discomfort with first sets of progressed exercises but reports improved tolerance with repetition. HEP updated as above, pt verbalizes good understanding. No adverse events, denies any overt increase in pain on departure. Pt departs today's session in no acute distress, all voiced questions/concerns addressed appropriately from PT perspective.    Eval: Patient is a 31 y.o. woman who was seen today for physical therapy evaluation and treatment for L ankle/foot pain. Pt reports rolling her ankle in September - has improved somewhat compared to initial onset but continues to deal with swelling/pain, XR unremarkable. On examination pt demonstrates concordant TTP posterolateral ankle with some visible edema, no bony tenderness or gross ligamentous instability. Ankle mobility/strength deficits as above. Pt would likely benefit from skilled PT in order to address these deficits and maximize functional tolerance. Tolerates HEP without increase in resting pain (although some transient symptom increase with heel/toe raises,  non worsening) and no adverse events. Pt departs today's session in no acute distress, all voiced questions/concerns addressed appropriately from PT perspective.     OBJECTIVE IMPAIRMENTS: Abnormal gait, decreased activity tolerance, decreased balance, decreased endurance, decreased mobility, difficulty walking, decreased ROM, decreased strength, hypomobility, increased edema, and pain.    ACTIVITY LIMITATIONS: carrying, lifting, bending, standing, squatting, and locomotion level   PARTICIPATION LIMITATIONS: community activity and yard work   PERSONAL FACTORS: Time since onset of injury/illness/exacerbation are also affecting patient's functional outcome.    REHAB POTENTIAL: Good   CLINICAL DECISION MAKING: Stable/uncomplicated   EVALUATION COMPLEXITY: Low     GOALS: Goals reviewed with patient? Yes   SHORT TERM GOALS: Target date: 09/24/2022   Pt will demonstrate appropriate understanding and performance of initially prescribed HEP in order to facilitate improved independence with management of symptoms.  Baseline: HEP provided on eval Goal status: INITIAL    2. Pt will score greater than or equal to 65 on FOTO in order to demonstrate improved perception of function  due to symptoms.            Baseline: 59            Goal status: INITIAL    LONG TERM GOALS: Target date: 10/15/2022    Pt will score 74 or greater on FOTO in order to demonstrate improved perception of function due to symptoms.  Baseline: 59 Goal status: INITIAL   2.  Pt will demonstrate at least 10 degrees of L ankle DF AROM in order to facilitate improved tolerance to functional movements such as squatting/walking.  Baseline: see ROM chart above Goal status: INITIAL   3.  Pt will demonstrate 5/5 L ankle MMT in order to demonstrate improved strength for functional WB. Baseline: see MMT chart above Goal status: INITIAL   4.  Pt will report/demonstrate ability to stand/walk for up to on even/uneven  surfaces with less than 2/10 pain on NPS in order to facilitate improved tolerance to community ambulation.  Baseline: 5/10 pain with prolonged WB/ambulation, difficulty with uneven surfaces Goal status: INITIA   5. Pt will demonstrate ability to perform full squat/crouch with less than 2/10 pain on NPS in order to promote tolerance to household/recreational activities.  Baseline: Pain w/ crouching, up to 5/10 reported Goal status: INITIAL     PLAN:   PT FREQUENCY: 2x/week   PT DURATION: 6 weeks   PLANNED INTERVENTIONS: Therapeutic exercises, Therapeutic activity, Neuromuscular re-education, Balance training, Gait training, Patient/Family education, Self Care, Joint mobilization, Joint manipulation, Stair training, Aquatic Therapy, Dry Needling, Electrical stimulation, Cryotherapy, Moist heat, Taping, Manual therapy, and Re-evaluation   PLAN FOR NEXT SESSION:  Review HEP. Incorporate more functional movements (STS, step ups)   Ashley Murrain PT, DPT 09/14/2022 4:49 PM

## 2022-09-14 ENCOUNTER — Ambulatory Visit: Payer: Medicare Other | Admitting: Physical Therapy

## 2022-09-14 ENCOUNTER — Encounter: Payer: Self-pay | Admitting: Physical Therapy

## 2022-09-14 DIAGNOSIS — M25672 Stiffness of left ankle, not elsewhere classified: Secondary | ICD-10-CM

## 2022-09-14 DIAGNOSIS — R262 Difficulty in walking, not elsewhere classified: Secondary | ICD-10-CM

## 2022-09-14 DIAGNOSIS — M6281 Muscle weakness (generalized): Secondary | ICD-10-CM

## 2022-09-14 DIAGNOSIS — M25572 Pain in left ankle and joints of left foot: Secondary | ICD-10-CM | POA: Diagnosis not present

## 2022-09-17 ENCOUNTER — Ambulatory Visit: Payer: Medicare Other | Admitting: Physical Therapy

## 2022-09-17 ENCOUNTER — Encounter: Payer: Self-pay | Admitting: Physical Therapy

## 2022-09-17 DIAGNOSIS — M6281 Muscle weakness (generalized): Secondary | ICD-10-CM

## 2022-09-17 DIAGNOSIS — M25572 Pain in left ankle and joints of left foot: Secondary | ICD-10-CM

## 2022-09-17 DIAGNOSIS — R262 Difficulty in walking, not elsewhere classified: Secondary | ICD-10-CM

## 2022-09-17 DIAGNOSIS — M25672 Stiffness of left ankle, not elsewhere classified: Secondary | ICD-10-CM

## 2022-09-17 NOTE — Therapy (Signed)
OUTPATIENT PHYSICAL THERAPY TREATMENT NOTE   Patient Name: [name] MRN: [id number] DOB:Mar 25, 1991, 31 y.o., female Today's Date: 09/17/2022  PCP: [facility]   REFERRING PROVIDER: Donato Schultz, FNP  END OF SESSION:   PT End of Session - 09/17/22 0941     Visit Number 5    Number of Visits 13    Date for PT Re-Evaluation 10/29/22    Authorization Type Medicare AB    Progress Note Due on Visit 10    PT Start Time 0940   pt late   PT Stop Time 1008    PT Time Calculation (min) 28 min               Past Medical History:  Diagnosis Date   Asthma    Depression    Nerve pain    hx shingles   PTSD (post-traumatic stress disorder)    Shingles 2010   Past Surgical History:  Procedure Laterality Date   INCISE AND DRAIN ABCESS     Patient Active Problem List   Diagnosis Date Noted   Schizoaffective disorder (HCC) 07/27/2022   HGSIL (high grade squamous intraepithelial lesion) on Pap smear of cervix 08/22/2016   DISTURBANCE OF SKIN SENSATION 10/23/2010   BIPOLAR DISORDER UNSPECIFIED 02/25/2010   POST TRAUMATIC STRESS SYNDROME 02/25/2010   ATTENTION DEFICIT DISORDER 02/25/2010   ALLERGIC RHINITIS 02/25/2010   ASTHMA 02/25/2010    REFERRING DIAG: M25.572 (ICD-10-CM) - Pain in left ankle and joints of left foot   THERAPY DIAG:  Pain in left ankle and joints of left foot  Stiffness of left ankle, not elsewhere classified  Muscle weakness (generalized)  Difficulty in walking, not elsewhere classified  Rationale for Evaluation and Treatment Rehabilitation  PERTINENT HISTORY: PTSD, anxiety/depression, asthma; rolled ankle September 2023  PRECAUTIONS: none  SUBJECTIVE:                                                                                                                                                                                      SUBJECTIVE STATEMENT:   Pt states that her foot/ankle has been doing well  since last session, less swollen last night after daily activity compared to normal. No pain at present. Did have some soreness in peroneals after last session. Otherwise no new updates    PAIN:   Are you having pain: yes NPRS: 3/10, up to 4-5/10 with standing walking, ADLS Location: L ankle, posterolateral How would you describe your pain? stiff Best in past week: 0/10 Worst in past week: 5/10 Aggravating factors: WB, crouching, walking uphill, uneven surfaces Easing factors: rest, ice, medication, elevation    OBJECTIVE: (  objective measures completed at initial evaluation unless otherwise dated)   DIAGNOSTIC FINDINGS: unremarkable L ankle XR 05/31/22   PATIENT SURVEYS:  FOTO 59, 74 predicted   COGNITION: Overall cognitive status: Within functional limits for tasks assessed                         SENSATION: Light touch grossly intact on exam   EDEMA:  Mild localized edema along posterolateral ankle between lateral malleolus and distal achilles; not formally measured   PALPATION: Mild TTP distal achilles tendon, posterolateral ankle between lateral malleolus and distal achilles. Mild discomfort distal peroneals Fixed calcaneus + forefoot eversion concordant pain   LOWER EXTREMITY MMT:   MMT Right eval Left eval  Ankle dorsiflexion 5 4+  Ankle plantarflexion 5 4 p!  Ankle inversion 5 4  Ankle eversion 5 4p!   (Blank rows = not tested)   LOWER EXTREMITY MOBILITY:   AROM Right eval Left eval  Ankle dorsiflexion 10 5  Ankle plantarflexion 50 50  Ankle inversion 25 20  Ankle eversion 10 8   (Blank rows = not tested) Comments: Mild discomfort with inversion and dorsiflexion, eversion most uncomfortable   LOWER EXTREMITY SPECIAL TESTS:  No gross ligamentous instability, discomfort with ROM as above   GAIT: Distance walked: within clinic Assistive device utilized: None Level of assistance: Complete Independence Comments: mild antalgic gait on L, reduced step  length and cadence     TODAY'S TREATMENT:                                                                                                    [facility] Adult PT Treatment:                                                DATE: 09/17/22 Therapeutic Exercise: Seated to scrunches  Seated inversion and eversion towel slides  Seated heel raises x 10 Seated toe raises x 10 Supine yellow band resistance supine with tactile cues x 10 each way- apprehension/weakness, not painful  Supine Ankle ABCs  Supine DF stretch with towel -cues for increased DF     [facility] Adult PT Treatment:                                                DATE: 09/14/22 Therapeutic Exercise: RTB plantarflexion 2x8 seated cues for form and setup HEP review/update  Neuromuscular re-ed: Toe scrunches WB approx through thigh 2x10 Post tib isos x20 Toe splays WB approx through thigh 2x10   [facility] Adult PT Treatment:  DATE: 09/10/22 Therapeutic Exercise: Seated inversion/eversion 3x10 cues for reduced compensation at knee 4inch step up 2x8 cues for control and pacing Airex kickstand step up RLE leading 2x8 HEP review  Neuromuscular re-ed: Toe scrunches x20, seated, tactile/verbal cues as needed  faded feedback Toe splays x20, tactile/verbal cues as needed  cues for tripod foot  Post tib isos 2x10 cues for form, ball between heels, cues for pacing  [facility]                                                [facility]: 09/07/22 Therapeutic Exercise: Seated DF 2x10 cues for form Seated inversion/eversion 2x10 cues for comfortable ROM Long sitting AP x10 cues for pacing and ROM Standing DF stretch w/ slant board 3x30sec cues for appropriate   Neuromuscular re-ed: Isolated hallux extension x20, tactile/verbal cues as needed to mitigate compensations, cues for tripod foot Toe scrunches x20 tactile/verbal cues as needed, cues for tripod foot Seated toe splays x20 cues for form  and tripod foot    PATIENT EDUCATION:  Education details: Reviewed/updated HEP, rationale for interventions, relevant anatomy/physiology Person educated: Patient Education method: Explanation, Demonstration, Tactile cues, Verbal cues Education comprehension: verbalized understanding, returned demonstration, verbal cues required, tactile cues required, and needs further education     HOME EXERCISE PROGRAM: Access Code: [email or web address] URL: [email or web address] Date: 09/14/2022 Prepared by: Fransisco Hertzavid Guthrie  Exercises - Ankle Inversion Eversion Towel Slide  - 1 x daily - 7 x weekly - 3 sets - 10 reps - Seated Toe Towel Scrunches  - 1 x daily - 7 x weekly - 2 sets - 10 reps - Toe Yoga - Alternating Great Toe and Lesser Toe Extension  - 1 x daily - 7 x weekly - 2 sets - 10 reps Added - Seated Ankle Alphabet  - 1 x daily - 7 x weekly - 1 sets - Seated Heel Raise  - 1 x daily - 7 x weekly - 1-2 sets - 10 reps - 5 hold   ASSESSMENT:   CLINICAL IMPRESSION: Pt reports increased pain and swelling after last session. Better today and less swelling. Continued open chain AROM, AAROM and light strengthening with yellow band resistance. She tolerated all therex with intermittent discomfort or feeling of weakness. She was given additional open chain exercises. She will be out of town for 2 weeks.    Eval: Patient is a 31 y.o. woman who was seen today for physical therapy evaluation and treatment for L ankle/foot pain. Pt reports rolling her ankle in September - has improved somewhat compared to initial onset but continues to deal with swelling/pain, XR unremarkable. On examination pt demonstrates concordant TTP posterolateral ankle with some visible edema, no bony tenderness or gross ligamentous instability. Ankle mobility/strength deficits as above. Pt would likely benefit from skilled PT in order to address these deficits and maximize functional tolerance. Tolerates HEP without increase in  resting pain (although some transient symptom increase with heel/toe raises, non worsening) and no adverse events. Pt departs today's session in no acute distress, all voiced questions/concerns addressed appropriately from PT perspective.     OBJECTIVE IMPAIRMENTS: Abnormal gait, decreased activity tolerance, decreased balance, decreased endurance, decreased mobility, difficulty walking, decreased ROM, decreased strength, hypomobility, increased edema, and pain.    ACTIVITY LIMITATIONS: carrying, lifting, bending, standing, squatting, and locomotion level   PARTICIPATION LIMITATIONS: community  activity and yard work   PERSONAL FACTORS: Time since onset of injury/illness/exacerbation are also affecting patient's functional outcome.    REHAB POTENTIAL: Good   CLINICAL DECISION MAKING: Stable/uncomplicated   EVALUATION COMPLEXITY: Low     GOALS: Goals reviewed with patient? Yes   SHORT TERM GOALS: Target date: 09/24/2022   Pt will demonstrate appropriate understanding and performance of initially prescribed HEP in order to facilitate improved independence with management of symptoms.  Baseline: HEP provided on eval Goal status: INITIAL    2. Pt will score greater than or equal to 65 on FOTO in order to demonstrate improved perception of function due to symptoms.            Baseline: 59            Goal status: INITIAL    LONG TERM GOALS: Target date: 10/15/2022    Pt will score 74 or greater on FOTO in order to demonstrate improved perception of function due to symptoms.  Baseline: 59 Goal status: INITIAL   2.  Pt will demonstrate at least 10 degrees of L ankle DF AROM in order to facilitate improved tolerance to functional movements such as squatting/walking.  Baseline: see ROM chart above Goal status: INITIAL   3.  Pt will demonstrate 5/5 L ankle MMT in order to demonstrate improved strength for functional WB. Baseline: see MMT chart above Goal status: INITIAL   4.  Pt will  report/demonstrate ability to stand/walk for up to on even/uneven surfaces with less than 2/10 pain on NPS in order to facilitate improved tolerance to community ambulation.  Baseline: 5/10 pain with prolonged WB/ambulation, difficulty with uneven surfaces Goal status: INITIA   5. Pt will demonstrate ability to perform full squat/crouch with less than 2/10 pain on NPS in order to promote tolerance to household/recreational activities.  Baseline: Pain w/ crouching, up to 5/10 reported Goal status: INITIAL     PLAN:   PT FREQUENCY: 2x/week   PT DURATION: 6 weeks   PLANNED INTERVENTIONS: Therapeutic exercises, Therapeutic activity, Neuromuscular re-education, Balance training, Gait training, Patient/Family education, Self Care, Joint mobilization, Joint manipulation, Stair training, Aquatic Therapy, Dry Needling, Electrical stimulation, Cryotherapy, Moist heat, Taping, Manual therapy, and Re-evaluation   PLAN FOR NEXT SESSION:  Review HEP. Incorporate more functional movements (STS, step ups)   Jannette Spanner, PTA 09/17/22 10:13 AM Phone: [phone] Fax: [phone]

## 2022-10-02 NOTE — Therapy (Incomplete)
OUTPATIENT PHYSICAL THERAPY TREATMENT NOTE   Patient Name: [name] MRN: [id number] DOB:1991/02/11, 32 y.o., female Today's Date: 10/02/2022  PCP: [facility], [facility]   REFERRING PROVIDER: Demetrios Isaacs, FNP  END OF SESSION:       Past Medical History:  Diagnosis Date   Asthma    Depression    Nerve pain    hx shingles   PTSD (post-traumatic stress disorder)    Shingles 2010   Past Surgical History:  Procedure Laterality Date   INCISE AND DRAIN ABCESS     Patient Active Problem List   Diagnosis Date Noted   Schizoaffective disorder ([place]) 07/27/2022   HGSIL (high grade squamous intraepithelial lesion) on Pap smear of cervix 08/22/2016   DISTURBANCE OF SKIN SENSATION 10/23/2010   BIPOLAR DISORDER UNSPECIFIED 02/25/2010   POST TRAUMATIC STRESS SYNDROME 02/25/2010   ATTENTION DEFICIT DISORDER 02/25/2010   ALLERGIC RHINITIS 02/25/2010   ASTHMA 02/25/2010    REFERRING DIAG: M25.572 (ICD-10-CM) - Pain in left ankle and joints of left foot   THERAPY DIAG:  No diagnosis found.  Rationale for Evaluation and Treatment Rehabilitation  PERTINENT HISTORY: PTSD, anxiety/depression, asthma; rolled ankle September 2023  PRECAUTIONS: none  SUBJECTIVE:                                                                                                                                                                                      SUBJECTIVE STATEMENT:   ***  *** Pt states that her foot/ankle has been doing well since last session, less swollen last night after daily activity compared to normal. No pain at present. Did have some soreness in peroneals after last session. Otherwise no new updates    PAIN:   Are you having pain: yes NPRS: 3/10, up to 4-5/10 with standing walking, ADLS Location: L ankle, posterolateral How would you describe your pain? stiff Best in past week: 0/10 Worst in past week: 5/10 Aggravating factors: WB, crouching,  walking uphill, uneven surfaces Easing factors: rest, ice, medication, elevation    OBJECTIVE: (objective measures completed at initial evaluation unless otherwise dated)   DIAGNOSTIC FINDINGS: unremarkable L ankle XR 05/31/22   PATIENT SURVEYS:  FOTO 59, 74 predicted   COGNITION: Overall cognitive status: Within functional limits for tasks assessed                         SENSATION: Light touch grossly intact on exam   EDEMA:  Mild localized edema along posterolateral ankle between lateral malleolus and distal achilles; not formally measured   PALPATION: Mild TTP distal achilles tendon, posterolateral ankle between lateral malleolus and  distal achilles. Mild discomfort distal peroneals Fixed calcaneus + forefoot eversion concordant pain   LOWER EXTREMITY MMT:   MMT Right eval Left eval  Ankle dorsiflexion 5 4+  Ankle plantarflexion 5 4 p!  Ankle inversion 5 4  Ankle eversion 5 4p!   (Blank rows = not tested)   LOWER EXTREMITY MOBILITY:   AROM Right eval Left eval  Ankle dorsiflexion 10 5  Ankle plantarflexion 50 50  Ankle inversion 25 20  Ankle eversion 10 8   (Blank rows = not tested) Comments: Mild discomfort with inversion and dorsiflexion, eversion most uncomfortable   LOWER EXTREMITY SPECIAL TESTS:  No gross ligamentous instability, discomfort with ROM as above   GAIT: Distance walked: within clinic Assistive device utilized: None Level of assistance: Complete Independence Comments: mild antalgic gait on L, reduced step length and cadence     TODAY'S TREATMENT:                                                                                                [facility] Adult PT Treatment:                                                DATE: 10/05/22 Therapeutic Exercise: *** Manual Therapy: *** Neuromuscular re-ed: *** Therapeutic Activity: *** Modalities: *** Self Care: ***       [facility] Treatment:                                                 DATE: 09/17/22 Therapeutic Exercise: Seated to scrunches  Seated inversion and eversion towel slides  Seated heel raises x 10 Seated toe raises x 10 Supine yellow band resistance supine with tactile cues x 10 each way- apprehension/weakness, not painful  Supine Ankle ABCs  Supine DF stretch with towel -cues for increased DF     [facility] Adult PT Treatment:                                                DATE: 09/14/22 Therapeutic Exercise: RTB plantarflexion 2x8 seated cues for form and setup HEP review/update  Neuromuscular re-ed: Toe scrunches WB approx through thigh 2x10 Post tib isos x20 Toe splays WB approx through thigh 2x10   [facility] Adult PT Treatment:                                                DATE: 09/10/22 Therapeutic Exercise: Seated inversion/eversion 3x10 cues for reduced compensation at knee 4inch step up 2x8 cues for control and pacing Airex kickstand step up RLE leading  2x8 HEP review  Neuromuscular re-ed: Toe scrunches x20, seated, tactile/verbal cues as needed  faded feedback Toe splays x20, tactile/verbal cues as needed  cues for tripod foot  Post tib isos 2x10 cues for form, ball between heels, cues for pacing  [facility] Adult PT Treatment:                                                DATE: 09/07/22 Therapeutic Exercise: Seated DF 2x10 cues for form Seated inversion/eversion 2x10 cues for comfortable ROM Long sitting AP x10 cues for pacing and ROM Standing DF stretch w/ slant board 3x30sec cues for appropriate   Neuromuscular re-ed: Isolated hallux extension x20, tactile/verbal cues as needed to mitigate compensations, cues for tripod foot Toe scrunches x20 tactile/verbal cues as needed, cues for tripod foot Seated toe splays x20 cues for form and tripod foot    PATIENT EDUCATION:  Education details: Reviewed/updated HEP, rationale for interventions, relevant anatomy/physiology Person educated: Patient Education method: Explanation, Demonstration,  Tactile cues, Verbal cues Education comprehension: verbalized understanding, returned demonstration, verbal cues required, tactile cues required, and needs further education     HOME EXERCISE PROGRAM: Access Code: [email or web address] URL: [email or web address] Date: 09/14/2022 Prepared by: Enis Slipper  Exercises - Ankle Inversion Eversion Towel Slide  - 1 x daily - 7 x weekly - 3 sets - 10 reps - Seated Toe Towel Scrunches  - 1 x daily - 7 x weekly - 2 sets - 10 reps - Toe Yoga - Alternating Great Toe and Lesser Toe Extension  - 1 x daily - 7 x weekly - 2 sets - 10 reps Added - Seated Ankle Alphabet  - 1 x daily - 7 x weekly - 1 sets - Seated Heel Raise  - 1 x daily - 7 x weekly - 1-2 sets - 10 reps - 5 hold   ASSESSMENT:   CLINICAL IMPRESSION: ***  *** Pt reports increased pain and swelling after last session. Better today and less swelling. Continued open chain AROM, AAROM and light strengthening with yellow band resistance. She tolerated all therex with intermittent discomfort or feeling of weakness. She was given additional open chain exercises. She will be out of town for 2 weeks.    Eval: Patient is a 32 y.o. woman who was seen today for physical therapy evaluation and treatment for L ankle/foot pain. Pt reports rolling her ankle in September - has improved somewhat compared to initial onset but continues to deal with swelling/pain, XR unremarkable. On examination pt demonstrates concordant TTP posterolateral ankle with some visible edema, no bony tenderness or gross ligamentous instability. Ankle mobility/strength deficits as above. Pt would likely benefit from skilled PT in order to address these deficits and maximize functional tolerance. Tolerates HEP without increase in resting pain (although some transient symptom increase with heel/toe raises, non worsening) and no adverse events. Pt departs today's session in no acute distress, all voiced questions/concerns addressed  appropriately from PT perspective.     OBJECTIVE IMPAIRMENTS: Abnormal gait, decreased activity tolerance, decreased balance, decreased endurance, decreased mobility, difficulty walking, decreased ROM, decreased strength, hypomobility, increased edema, and pain.    ACTIVITY LIMITATIONS: carrying, lifting, bending, standing, squatting, and locomotion level   PARTICIPATION LIMITATIONS: community activity and yard work   PERSONAL FACTORS: Time since onset of injury/illness/exacerbation are also affecting patient's functional outcome.    REHAB POTENTIAL: Good  CLINICAL DECISION MAKING: Stable/uncomplicated   EVALUATION COMPLEXITY: Low     GOALS: Goals reviewed with patient? Yes   SHORT TERM GOALS: Target date: 09/24/2022   Pt will demonstrate appropriate understanding and performance of initially prescribed HEP in order to facilitate improved independence with management of symptoms.  Baseline: HEP provided on eval Goal status: INITIAL    2. Pt will score greater than or equal to 65 on FOTO in order to demonstrate improved perception of function due to symptoms.            Baseline: 59            Goal status: INITIAL    LONG TERM GOALS: Target date: 10/15/2022    Pt will score 74 or greater on FOTO in order to demonstrate improved perception of function due to symptoms.  Baseline: 59 Goal status: INITIAL   2.  Pt will demonstrate at least 10 degrees of L ankle DF AROM in order to facilitate improved tolerance to functional movements such as squatting/walking.  Baseline: see ROM chart above Goal status: INITIAL   3.  Pt will demonstrate 5/5 L ankle MMT in order to demonstrate improved strength for functional WB. Baseline: see MMT chart above Goal status: INITIAL   4.  Pt will report/demonstrate ability to stand/walk for up to 18min on even/uneven surfaces with less than 2/10 pain on NPS in order to facilitate improved tolerance to community ambulation.  Baseline: 5/10 pain  with prolonged WB/ambulation, difficulty with uneven surfaces Goal status: INITIA   5. Pt will demonstrate ability to perform full squat/crouch with less than 2/10 pain on NPS in order to promote tolerance to household/recreational activities.  Baseline: Pain w/ crouching, up to 5/10 reported Goal status: INITIAL     PLAN:   PT FREQUENCY: 2x/week   PT DURATION: 6 weeks   PLANNED INTERVENTIONS: Therapeutic exercises, Therapeutic activity, Neuromuscular re-education, Balance training, Gait training, Patient/Family education, Self Care, Joint mobilization, Joint manipulation, Stair training, Aquatic Therapy, Dry Needling, Electrical stimulation, Cryotherapy, Moist heat, Taping, Manual therapy, and Re-evaluation   PLAN FOR NEXT SESSION: *** Review HEP. Incorporate more functional movements (STS, step ups)   Leeroy Cha PT, DPT 10/02/2022 12:00 PM

## 2022-10-05 ENCOUNTER — Ambulatory Visit: Payer: Medicare Other | Admitting: Physical Therapy

## 2022-10-07 NOTE — Therapy (Incomplete)
OUTPATIENT PHYSICAL THERAPY TREATMENT NOTE   Patient Name: [name] MRN: [id number] DOB:11-20-1990, 32 y.o., female Today's Date: 10/07/2022  PCP: [facility], [facility]   REFERRING PROVIDER: Demetrios Isaacs, FNP  END OF SESSION:       Past Medical History:  Diagnosis Date   Asthma    Depression    Nerve pain    hx shingles   PTSD (post-traumatic stress disorder)    Shingles 2010   Past Surgical History:  Procedure Laterality Date   INCISE AND DRAIN ABCESS     Patient Active Problem List   Diagnosis Date Noted   Schizoaffective disorder ([place]) 07/27/2022   HGSIL (high grade squamous intraepithelial lesion) on Pap smear of cervix 08/22/2016   DISTURBANCE OF SKIN SENSATION 10/23/2010   BIPOLAR DISORDER UNSPECIFIED 02/25/2010   POST TRAUMATIC STRESS SYNDROME 02/25/2010   ATTENTION DEFICIT DISORDER 02/25/2010   ALLERGIC RHINITIS 02/25/2010   ASTHMA 02/25/2010    REFERRING DIAG: M25.572 (ICD-10-CM) - Pain in left ankle and joints of left foot   THERAPY DIAG:  No diagnosis found.  Rationale for Evaluation and Treatment Rehabilitation  PERTINENT HISTORY: PTSD, anxiety/depression, asthma; rolled ankle September 2023  PRECAUTIONS: none  SUBJECTIVE:                                                                                                                                                                                      SUBJECTIVE STATEMENT:   ***  *** Pt states that her foot/ankle has been doing well since last session, less swollen last night after daily activity compared to normal. No pain at present. Did have some soreness in peroneals after last session. Otherwise no new updates    PAIN:   Are you having pain: yes NPRS: 3/10, up to 4-5/10 with standing walking, ADLS Location: L ankle, posterolateral How would you describe your pain? stiff Best in past week: 0/10 Worst in past week: 5/10 Aggravating factors: WB, crouching,  walking uphill, uneven surfaces Easing factors: rest, ice, medication, elevation    OBJECTIVE: (objective measures completed at initial evaluation unless otherwise dated)   DIAGNOSTIC FINDINGS: unremarkable L ankle XR 05/31/22   PATIENT SURVEYS:  FOTO 59, 74 predicted   COGNITION: Overall cognitive status: Within functional limits for tasks assessed                         SENSATION: Light touch grossly intact on exam   EDEMA:  Mild localized edema along posterolateral ankle between lateral malleolus and distal achilles; not formally measured   PALPATION: Mild TTP distal achilles tendon, posterolateral ankle between lateral malleolus and  distal achilles. Mild discomfort distal peroneals Fixed calcaneus + forefoot eversion concordant pain   LOWER EXTREMITY MMT:   MMT Right eval Left eval  Ankle dorsiflexion 5 4+  Ankle plantarflexion 5 4 p!  Ankle inversion 5 4  Ankle eversion 5 4p!   (Blank rows = not tested)   LOWER EXTREMITY MOBILITY:   AROM Right eval Left eval  Ankle dorsiflexion 10 5  Ankle plantarflexion 50 50  Ankle inversion 25 20  Ankle eversion 10 8   (Blank rows = not tested) Comments: Mild discomfort with inversion and dorsiflexion, eversion most uncomfortable   LOWER EXTREMITY SPECIAL TESTS:  No gross ligamentous instability, discomfort with ROM as above   GAIT: Distance walked: within clinic Assistive device utilized: None Level of assistance: Complete Independence Comments: mild antalgic gait on L, reduced step length and cadence     TODAY'S TREATMENT:                                                                                                [facility] Adult PT Treatment:                                                DATE: 10/08/22 Therapeutic Exercise: *** Manual Therapy: *** Neuromuscular re-ed: *** Therapeutic Activity: *** Modalities: *** Self Care: ***       [facility] PT Treatment:                                                 DATE: 09/17/22 Therapeutic Exercise: Seated to scrunches  Seated inversion and eversion towel slides  Seated heel raises x 10 Seated toe raises x 10 Supine yellow band resistance supine with tactile cues x 10 each way- apprehension/weakness, not painful  Supine Ankle ABCs  Supine DF stretch with towel -cues for increased DF     [facility] Adult PT Treatment:                                                DATE: 09/14/22 Therapeutic Exercise: RTB plantarflexion 2x8 seated cues for form and setup HEP review/update  Neuromuscular re-ed: Toe scrunches WB approx through thigh 2x10 Post tib isos x20 Toe splays WB approx through thigh 2x10   [facility] Adult PT Treatment:                                                DATE: 09/10/22 Therapeutic Exercise: Seated inversion/eversion 3x10 cues for reduced compensation at knee 4inch step up 2x8 cues for control and pacing Airex kickstand step up RLE leading  2x8 HEP review  Neuromuscular re-ed: Toe scrunches x20, seated, tactile/verbal cues as needed  faded feedback Toe splays x20, tactile/verbal cues as needed  cues for tripod foot  Post tib isos 2x10 cues for form, ball between heels, cues for pacing  [facility] Adult PT Treatment:                                                DATE: 09/07/22 Therapeutic Exercise: Seated DF 2x10 cues for form Seated inversion/eversion 2x10 cues for comfortable ROM Long sitting AP x10 cues for pacing and ROM Standing DF stretch w/ slant board 3x30sec cues for appropriate   Neuromuscular re-ed: Isolated hallux extension x20, tactile/verbal cues as needed to mitigate compensations, cues for tripod foot Toe scrunches x20 tactile/verbal cues as needed, cues for tripod foot Seated toe splays x20 cues for form and tripod foot    PATIENT EDUCATION:  Education details: Reviewed/updated HEP, rationale for interventions, relevant anatomy/physiology Person educated: Patient Education method: Explanation, Demonstration,  Tactile cues, Verbal cues Education comprehension: verbalized understanding, returned demonstration, verbal cues required, tactile cues required, and needs further education     HOME EXERCISE PROGRAM: Access Code: [email or web address] URL: [email or web address] Date: 09/14/2022 Prepared by: Enis Slipper  Exercises - Ankle Inversion Eversion Towel Slide  - 1 x daily - 7 x weekly - 3 sets - 10 reps - Seated Toe Towel Scrunches  - 1 x daily - 7 x weekly - 2 sets - 10 reps - Toe Yoga - Alternating Great Toe and Lesser Toe Extension  - 1 x daily - 7 x weekly - 2 sets - 10 reps Added - Seated Ankle Alphabet  - 1 x daily - 7 x weekly - 1 sets - Seated Heel Raise  - 1 x daily - 7 x weekly - 1-2 sets - 10 reps - 5 hold   ASSESSMENT:   CLINICAL IMPRESSION: ***  *** Pt reports increased pain and swelling after last session. Better today and less swelling. Continued open chain AROM, AAROM and light strengthening with yellow band resistance. She tolerated all therex with intermittent discomfort or feeling of weakness. She was given additional open chain exercises. She will be out of town for 2 weeks.    Eval: Patient is a 32 y.o. woman who was seen today for physical therapy evaluation and treatment for L ankle/foot pain. Pt reports rolling her ankle in September - has improved somewhat compared to initial onset but continues to deal with swelling/pain, XR unremarkable. On examination pt demonstrates concordant TTP posterolateral ankle with some visible edema, no bony tenderness or gross ligamentous instability. Ankle mobility/strength deficits as above. Pt would likely benefit from skilled PT in order to address these deficits and maximize functional tolerance. Tolerates HEP without increase in resting pain (although some transient symptom increase with heel/toe raises, non worsening) and no adverse events. Pt departs today's session in no acute distress, all voiced questions/concerns addressed  appropriately from PT perspective.     OBJECTIVE IMPAIRMENTS: Abnormal gait, decreased activity tolerance, decreased balance, decreased endurance, decreased mobility, difficulty walking, decreased ROM, decreased strength, hypomobility, increased edema, and pain.    ACTIVITY LIMITATIONS: carrying, lifting, bending, standing, squatting, and locomotion level   PARTICIPATION LIMITATIONS: community activity and yard work   PERSONAL FACTORS: Time since onset of injury/illness/exacerbation are also affecting patient's functional outcome.    REHAB POTENTIAL: Good  CLINICAL DECISION MAKING: Stable/uncomplicated   EVALUATION COMPLEXITY: Low     GOALS: Goals reviewed with patient? Yes   SHORT TERM GOALS: Target date: 09/24/2022   Pt will demonstrate appropriate understanding and performance of initially prescribed HEP in order to facilitate improved independence with management of symptoms.  Baseline: HEP provided on eval Goal status: INITIAL    2. Pt will score greater than or equal to 65 on FOTO in order to demonstrate improved perception of function due to symptoms.            Baseline: 59            Goal status: INITIAL    LONG TERM GOALS: Target date: 10/15/2022    Pt will score 74 or greater on FOTO in order to demonstrate improved perception of function due to symptoms.  Baseline: 59 Goal status: INITIAL   2.  Pt will demonstrate at least 10 degrees of L ankle DF AROM in order to facilitate improved tolerance to functional movements such as squatting/walking.  Baseline: see ROM chart above Goal status: INITIAL   3.  Pt will demonstrate 5/5 L ankle MMT in order to demonstrate improved strength for functional WB. Baseline: see MMT chart above Goal status: INITIAL   4.  Pt will report/demonstrate ability to stand/walk for up to on even/uneven surfaces with less than 2/10 pain on NPS in order to facilitate improved tolerance to community ambulation.  Baseline: 5/10 pain  with prolonged WB/ambulation, difficulty with uneven surfaces Goal status: INITIA   5. Pt will demonstrate ability to perform full squat/crouch with less than 2/10 pain on NPS in order to promote tolerance to household/recreational activities.  Baseline: Pain w/ crouching, up to 5/10 reported Goal status: INITIAL     PLAN:   PT FREQUENCY: 2x/week   PT DURATION: 6 weeks   PLANNED INTERVENTIONS: Therapeutic exercises, Therapeutic activity, Neuromuscular re-education, Balance training, Gait training, Patient/Family education, Self Care, Joint mobilization, Joint manipulation, Stair training, Aquatic Therapy, Dry Needling, Electrical stimulation, Cryotherapy, Moist heat, Taping, Manual therapy, and Re-evaluation   PLAN FOR NEXT SESSION: *** Review HEP. Incorporate more functional movements (STS, step ups)   Ashley Murrain PT, DPT 10/07/2022 8:37 AM

## 2022-10-08 ENCOUNTER — Ambulatory Visit: Payer: Medicare Other | Admitting: Physical Therapy

## 2022-10-09 NOTE — Therapy (Incomplete)
OUTPATIENT PHYSICAL THERAPY TREATMENT NOTE   Patient Name: [name] MRN: [id number] DOB:Jun 23, 1991, 32 y.o., female Today's Date: 10/09/2022  PCP: [facility], [facility]   REFERRING PROVIDER: Demetrios Isaacs, FNP  END OF SESSION:       Past Medical History:  Diagnosis Date   Asthma    Depression    Nerve pain    hx shingles   PTSD (post-traumatic stress disorder)    Shingles 2010   Past Surgical History:  Procedure Laterality Date   INCISE AND DRAIN ABCESS     Patient Active Problem List   Diagnosis Date Noted   Schizoaffective disorder ([place]) 07/27/2022   HGSIL (high grade squamous intraepithelial lesion) on Pap smear of cervix 08/22/2016   DISTURBANCE OF SKIN SENSATION 10/23/2010   BIPOLAR DISORDER UNSPECIFIED 02/25/2010   POST TRAUMATIC STRESS SYNDROME 02/25/2010   ATTENTION DEFICIT DISORDER 02/25/2010   ALLERGIC RHINITIS 02/25/2010   ASTHMA 02/25/2010    REFERRING DIAG: M25.572 (ICD-10-CM) - Pain in left ankle and joints of left foot   THERAPY DIAG:  No diagnosis found.  Rationale for Evaluation and Treatment Rehabilitation  PERTINENT HISTORY: PTSD, anxiety/depression, asthma; rolled ankle September 2023  PRECAUTIONS: none  SUBJECTIVE:                                                                                                                                                                                      SUBJECTIVE STATEMENT:   ***  *** Pt states that her foot/ankle has been doing well since last session, less swollen last night after daily activity compared to normal. No pain at present. Did have some soreness in peroneals after last session. Otherwise no new updates    PAIN:   Are you having pain: yes NPRS: 3/10, up to 4-5/10 with standing walking, ADLS Location: L ankle, posterolateral How would you describe your pain? stiff Best in past week: 0/10 Worst in past week: 5/10 Aggravating factors: WB, crouching,  walking uphill, uneven surfaces Easing factors: rest, ice, medication, elevation    OBJECTIVE: (objective measures completed at initial evaluation unless otherwise dated)   DIAGNOSTIC FINDINGS: unremarkable L ankle XR 05/31/22   PATIENT SURVEYS:  FOTO 59, 74 predicted   COGNITION: Overall cognitive status: Within functional limits for tasks assessed                         SENSATION: Light touch grossly intact on exam   EDEMA:  Mild localized edema along posterolateral ankle between lateral malleolus and distal achilles; not formally measured   PALPATION: Mild TTP distal achilles tendon, posterolateral ankle between lateral malleolus and  distal achilles. Mild discomfort distal peroneals Fixed calcaneus + forefoot eversion concordant pain   LOWER EXTREMITY MMT:   MMT Right eval Left eval  Ankle dorsiflexion 5 4+  Ankle plantarflexion 5 4 p!  Ankle inversion 5 4  Ankle eversion 5 4p!   (Blank rows = not tested)   LOWER EXTREMITY MOBILITY:   AROM Right eval Left eval  Ankle dorsiflexion 10 5  Ankle plantarflexion 50 50  Ankle inversion 25 20  Ankle eversion 10 8   (Blank rows = not tested) Comments: Mild discomfort with inversion and dorsiflexion, eversion most uncomfortable   LOWER EXTREMITY SPECIAL TESTS:  No gross ligamentous instability, discomfort with ROM as above   GAIT: Distance walked: within clinic Assistive device utilized: None Level of assistance: Complete Independence Comments: mild antalgic gait on L, reduced step length and cadence     TODAY'S TREATMENT:                                                                                                [facility] Adult PT Treatment:                                                DATE: 10/12/22 Therapeutic Exercise: *** Manual Therapy: *** Neuromuscular re-ed: *** Therapeutic Activity: *** Modalities: *** Self Care: ***       [facility] Adult PT Treatment:                                                 DATE: 09/17/22 Therapeutic Exercise: Seated to scrunches  Seated inversion and eversion towel slides  Seated heel raises x 10 Seated toe raises x 10 Supine yellow band resistance supine with tactile cues x 10 each way- apprehension/weakness, not painful  Supine Ankle ABCs  Supine DF stretch with towel -cues for increased DF     [facility] Adult PT Treatment:                                                DATE: 09/14/22 Therapeutic Exercise: RTB plantarflexion 2x8 seated cues for form and setup HEP review/update  Neuromuscular re-ed: Toe scrunches WB approx through thigh 2x10 Post tib isos x20 Toe splays WB approx through thigh 2x10   [facility] Adult PT Treatment:                                                DATE: 09/10/22 Therapeutic Exercise: Seated inversion/eversion 3x10 cues for reduced compensation at knee 4inch step up 2x8 cues for control and pacing Airex kickstand step up RLE leading  2x8 HEP review  Neuromuscular re-ed: Toe scrunches x20, seated, tactile/verbal cues as needed  faded feedback Toe splays x20, tactile/verbal cues as needed  cues for tripod foot  Post tib isos 2x10 cues for form, ball between heels, cues for pacing    PATIENT EDUCATION:  Education details: rationale for interventions, relevant anatomy/physiology, HEP Person educated: Patient Education method: Explanation, Demonstration, Tactile cues, Verbal cues Education comprehension: verbalized understanding, returned demonstration, verbal cues required, tactile cues required, and needs further education     HOME EXERCISE PROGRAM: Access Code: [email or web address] URL: [email or web address] Date: 09/14/2022 Prepared by: Enis Slipper  Exercises - Ankle Inversion Eversion Towel Slide  - 1 x daily - 7 x weekly - 3 sets - 10 reps - Seated Toe Towel Scrunches  - 1 x daily - 7 x weekly - 2 sets - 10 reps - Toe Yoga - Alternating Great Toe and Lesser Toe Extension  - 1 x daily - 7 x weekly - 2 sets - 10  reps Added - Seated Ankle Alphabet  - 1 x daily - 7 x weekly - 1 sets - Seated Heel Raise  - 1 x daily - 7 x weekly - 1-2 sets - 10 reps - 5 hold   ASSESSMENT:   CLINICAL IMPRESSION: ***10/09/2022   *** Pt reports increased pain and swelling after last session. Better today and less swelling. Continued open chain AROM, AAROM and light strengthening with yellow band resistance. She tolerated all therex with intermittent discomfort or feeling of weakness. She was given additional open chain exercises. She will be out of town for 2 weeks.    Eval: Patient is a 32 y.o. woman who was seen today for physical therapy evaluation and treatment for L ankle/foot pain. Pt reports rolling her ankle in September - has improved somewhat compared to initial onset but continues to deal with swelling/pain, XR unremarkable. On examination pt demonstrates concordant TTP posterolateral ankle with some visible edema, no bony tenderness or gross ligamentous instability. Ankle mobility/strength deficits as above. Pt would likely benefit from skilled PT in order to address these deficits and maximize functional tolerance. Tolerates HEP without increase in resting pain (although some transient symptom increase with heel/toe raises, non worsening) and no adverse events. Pt departs today's session in no acute distress, all voiced questions/concerns addressed appropriately from PT perspective.     OBJECTIVE IMPAIRMENTS: Abnormal gait, decreased activity tolerance, decreased balance, decreased endurance, decreased mobility, difficulty walking, decreased ROM, decreased strength, hypomobility, increased edema, and pain.    ACTIVITY LIMITATIONS: carrying, lifting, bending, standing, squatting, and locomotion level   PARTICIPATION LIMITATIONS: community activity and yard work   PERSONAL FACTORS: Time since onset of injury/illness/exacerbation are also affecting patient's functional outcome.    REHAB POTENTIAL: Good   CLINICAL  DECISION MAKING: Stable/uncomplicated   EVALUATION COMPLEXITY: Low     GOALS: Goals reviewed with patient? Yes   SHORT TERM GOALS: Target date: 09/24/2022   Pt will demonstrate appropriate understanding and performance of initially prescribed HEP in order to facilitate improved independence with management of symptoms.  Baseline: HEP provided on eval Goal status: INITIAL    2. Pt will score greater than or equal to 65 on FOTO in order to demonstrate improved perception of function due to symptoms.            Baseline: 59            Goal status: INITIAL    LONG TERM GOALS: Target date: 10/15/2022    Pt will  score 74 or greater on FOTO in order to demonstrate improved perception of function due to symptoms.  Baseline: 59 Goal status: INITIAL   2.  Pt will demonstrate at least 10 degrees of L ankle DF AROM in order to facilitate improved tolerance to functional movements such as squatting/walking.  Baseline: see ROM chart above Goal status: INITIAL   3.  Pt will demonstrate 5/5 L ankle MMT in order to demonstrate improved strength for functional WB. Baseline: see MMT chart above Goal status: INITIAL   4.  Pt will report/demonstrate ability to stand/walk for up to on even/uneven surfaces with less than 2/10 pain on NPS in order to facilitate improved tolerance to community ambulation.  Baseline: 5/10 pain with prolonged WB/ambulation, difficulty with uneven surfaces Goal status: INITIA   5. Pt will demonstrate ability to perform full squat/crouch with less than 2/10 pain on NPS in order to promote tolerance to household/recreational activities.  Baseline: Pain w/ crouching, up to 5/10 reported Goal status: INITIAL     PLAN:   PT FREQUENCY: 2x/week   PT DURATION: 6 weeks   PLANNED INTERVENTIONS: Therapeutic exercises, Therapeutic activity, Neuromuscular re-education, Balance training, Gait training, Patient/Family education, Self Care, Joint mobilization, Joint  manipulation, Stair training, Aquatic Therapy, Dry Needling, Electrical stimulation, Cryotherapy, Moist heat, Taping, Manual therapy, and Re-evaluation   PLAN FOR NEXT SESSION: *** Review HEP. Incorporate more functional movements (STS, step ups)   Ashley Murrain PT, DPT 10/09/2022 8:48 AM

## 2022-10-12 ENCOUNTER — Encounter: Payer: Self-pay | Admitting: Physical Therapy

## 2022-10-12 ENCOUNTER — Ambulatory Visit: Payer: Medicare Other | Attending: Family | Admitting: Physical Therapy

## 2022-10-12 DIAGNOSIS — R262 Difficulty in walking, not elsewhere classified: Secondary | ICD-10-CM | POA: Diagnosis present

## 2022-10-12 DIAGNOSIS — M25572 Pain in left ankle and joints of left foot: Secondary | ICD-10-CM | POA: Diagnosis not present

## 2022-10-12 DIAGNOSIS — M25672 Stiffness of left ankle, not elsewhere classified: Secondary | ICD-10-CM | POA: Insufficient documentation

## 2022-10-12 DIAGNOSIS — M6281 Muscle weakness (generalized): Secondary | ICD-10-CM | POA: Diagnosis present

## 2022-10-12 NOTE — Therapy (Addendum)
OUTPATIENT PHYSICAL THERAPY TREATMENT NOTE + NO VISIT DISCHARGE (see below)   Patient Name: [name] MRN: [id number] DOB:05-30-1991, 32 y.o., female Today's Date: 10/12/2022  PCP: [facility]   REFERRING PROVIDER: Demetrios Isaacs, FNP  END OF SESSION:   PT End of Session - 10/12/22 1104     Visit Number 6    Number of Visits 13    Date for PT Re-Evaluation 10/29/22    Authorization Type Medicare [employer]    Progress Note Due on Visit 10    PT Start Time 1105   pt late arrival   PT Stop Time 1145    PT Time Calculation (min) 40 min    Activity Tolerance No increased pain;Patient tolerated treatment well    Behavior During Therapy [facility] for tasks assessed/performed                Past Medical History:  Diagnosis Date   Asthma    Depression    Nerve pain    hx shingles   PTSD (post-traumatic stress disorder)    Shingles 2010   Past Surgical History:  Procedure Laterality Date   INCISE AND DRAIN ABCESS     Patient Active Problem List   Diagnosis Date Noted   Schizoaffective disorder ([place]) 07/27/2022   HGSIL (high grade squamous intraepithelial lesion) on Pap smear of cervix 08/22/2016   DISTURBANCE OF SKIN SENSATION 10/23/2010   BIPOLAR DISORDER UNSPECIFIED 02/25/2010   POST TRAUMATIC STRESS SYNDROME 02/25/2010   ATTENTION DEFICIT DISORDER 02/25/2010   ALLERGIC RHINITIS 02/25/2010   ASTHMA 02/25/2010    REFERRING DIAG: M25.572 (ICD-10-CM) - Pain in left ankle and joints of left foot   THERAPY DIAG:  Pain in left ankle and joints of left foot  Stiffness of left ankle, not elsewhere classified  Muscle weakness (generalized)  Difficulty in walking, not elsewhere classified  Rationale for Evaluation and Treatment Rehabilitation  PERTINENT HISTORY: PTSD, anxiety/depression, asthma; rolled ankle September 2023  PRECAUTIONS: none  SUBJECTIVE:                                                                                                                                                                                       SUBJECTIVE STATEMENT:   Pt reports about a week of increased pain/swelling after last session that she attributes to banded inversion/eversion. Pt does note that after the pain/swelling improved she felt better. Did have a fall at the beginning of the year with mild increase in swelling but no overt change in symptoms. Reports limited tolerance to HEP until a couple days ago but has reintroduced without issue. Overall pt reports improved confidence  in ankle and improved functional mobility since starting therapy although continues to have exacerbations in pain/swelling.      PAIN:   Are you having pain: none NPRS: 0/10 Location: L ankle, posterolateral How would you describe your pain? stiff Best in past week: 0/10 Worst in past week: 5/10 Aggravating factors: WB, crouching, walking uphill, uneven surfaces Easing factors: rest, ice, medication, elevation    OBJECTIVE: (objective measures completed at initial evaluation unless otherwise dated)   DIAGNOSTIC FINDINGS: unremarkable L ankle XR 05/31/22   PATIENT SURVEYS:  FOTO 59, 74 predicted 10/12/22: 59%   COGNITION: Overall cognitive status: Within functional limits for tasks assessed                         SENSATION: Light touch grossly intact on exam   EDEMA:  Mild localized edema along posterolateral ankle between lateral malleolus and distal achilles; not formally measured   PALPATION: Mild TTP distal achilles tendon, posterolateral ankle between lateral malleolus and distal achilles. Mild discomfort distal peroneals Fixed calcaneus + forefoot eversion concordant pain   LOWER EXTREMITY MMT:   MMT Right eval Left eval  Ankle dorsiflexion 5 4+  Ankle plantarflexion 5 4 p!  Ankle inversion 5 4  Ankle eversion 5 4p!   (Blank rows = not tested)   LOWER EXTREMITY MOBILITY:   AROM Right eval Left eval  Ankle dorsiflexion  10 5  Ankle plantarflexion 50 50  Ankle inversion 25 20  Ankle eversion 10 8   (Blank rows = not tested) Comments: Mild discomfort with inversion and dorsiflexion, eversion most uncomfortable   LOWER EXTREMITY SPECIAL TESTS:  No gross ligamentous instability, discomfort with ROM as above   GAIT: Distance walked: within clinic Assistive device utilized: None Level of assistance: Complete Independence Comments: mild antalgic gait on L, reduced step length and cadence     TODAY'S TREATMENT:                                                                                                 [facility] Adult PT Treatment:                                                DATE: 10/12/22 Therapeutic Exercise: RTB plantarflexion only x8 cues for form and pacing Seated toe scrunches 2x15 Post tib isometrics w/ limited ROM heel raise seated 2x8 cues for form and pacing  Therapeutic Activity: FOTO + education Barefoot STS 3x5 cues for BOS and mechanics  Extensive time spent discussing pt symptoms as they relate to activity/functional mobility, discussion re: strategies to maximize tolerance      [facility] Adult PT Treatment:                                                DATE: 09/17/22 Therapeutic Exercise: Seated  to scrunches  Seated inversion and eversion towel slides  Seated heel raises x 10 Seated toe raises x 10 Supine yellow band resistance supine with tactile cues x 10 each way- apprehension/weakness, not painful  Supine Ankle ABCs  Supine DF stretch with towel -cues for increased DF     [facility] Adult PT Treatment:                                                DATE: 09/14/22 Therapeutic Exercise: RTB plantarflexion 2x8 seated cues for form and setup HEP review/update  Neuromuscular re-ed: Toe scrunches WB approx through thigh 2x10 Post tib isos x20 Toe splays WB approx through thigh 2x10   [facility] Adult PT Treatment:                                                DATE: 09/10/22 Therapeutic  Exercise: Seated inversion/eversion 3x10 cues for reduced compensation at knee 4inch step up 2x8 cues for control and pacing Airex kickstand step up RLE leading 2x8 HEP review  Neuromuscular re-ed: Toe scrunches x20, seated, tactile/verbal cues as needed  faded feedback Toe splays x20, tactile/verbal cues as needed  cues for tripod foot  Post tib isos 2x10 cues for form, ball between heels, cues for pacing    PATIENT EDUCATION:  Education details: rationale for interventions, relevant anatomy/physiology, HEP, activity modification Person educated: Patient Education method: Explanation, Demonstration, Tactile cues, Verbal cues Education comprehension: verbalized understanding, returned demonstration, verbal cues required, tactile cues required, and needs further education     HOME EXERCISE PROGRAM: Access Code: [email or web address] URL: [email or web address] Date: 09/14/2022 Prepared by: Enis Slipper  Exercises - Ankle Inversion Eversion Towel Slide  - 1 x daily - 7 x weekly - 3 sets - 10 reps - Seated Toe Towel Scrunches  - 1 x daily - 7 x weekly - 2 sets - 10 reps - Toe Yoga - Alternating Great Toe and Lesser Toe Extension  - 1 x daily - 7 x weekly - 2 sets - 10 reps Added - Seated Ankle Alphabet  - 1 x daily - 7 x weekly - 1 sets - Seated Heel Raise  - 1 x daily - 7 x weekly - 1-2 sets - 10 reps - 5 hold   ASSESSMENT:   CLINICAL IMPRESSION: 10/12/2022 Pt reports increased pain/swelling after last session, some difficulties over holidays (see subjective) but back to baseline today. Given reported irritability after last session regressed program as appropriate, pt tolerates well overall without increase in pain/swelling. Did not update HEP given reported difficulty in recent weeks. Significant time spent discussing FOTO, symptom behavior and activity modification. Encouraged pt to discuss continued swelling with MD. No adverse events, pt denies any increase in pain or swelling  on departure. Pt departs today's session in no acute distress, all voiced questions/concerns addressed appropriately from PT perspective.     Eval: Patient is a 32 y.o. woman who was seen today for physical therapy evaluation and treatment for L ankle/foot pain. Pt reports rolling her ankle in September - has improved somewhat compared to initial onset but continues to deal with swelling/pain, XR unremarkable. On examination pt demonstrates concordant TTP posterolateral ankle with some visible edema, no bony tenderness or  gross ligamentous instability. Ankle mobility/strength deficits as above. Pt would likely benefit from skilled PT in order to address these deficits and maximize functional tolerance. Tolerates HEP without increase in resting pain (although some transient symptom increase with heel/toe raises, non worsening) and no adverse events. Pt departs today's session in no acute distress, all voiced questions/concerns addressed appropriately from PT perspective.     OBJECTIVE IMPAIRMENTS: Abnormal gait, decreased activity tolerance, decreased balance, decreased endurance, decreased mobility, difficulty walking, decreased ROM, decreased strength, hypomobility, increased edema, and pain.    ACTIVITY LIMITATIONS: carrying, lifting, bending, standing, squatting, and locomotion level   PARTICIPATION LIMITATIONS: community activity and yard work   PERSONAL FACTORS: Time since onset of injury/illness/exacerbation are also affecting patient's functional outcome.    REHAB POTENTIAL: Good   CLINICAL DECISION MAKING: Stable/uncomplicated   EVALUATION COMPLEXITY: Low     GOALS: Goals reviewed with patient? Yes   SHORT TERM GOALS: Target date: 09/24/2022   Pt will demonstrate appropriate understanding and performance of initially prescribed HEP in order to facilitate improved independence with management of symptoms.  Baseline: HEP provided on eval 10/12/22: pt able to perform w/o assist Goal  status: MET   2. Pt will score greater than or equal to 65 on FOTO in order to demonstrate improved perception of function due to symptoms.            Baseline: 59  10/12/22: 59            Goal status: ONGOING   LONG TERM GOALS: Target date: 10/15/2022    Pt will score 74 or greater on FOTO in order to demonstrate improved perception of function due to symptoms.  Baseline: 59 Goal status: INITIAL   2.  Pt will demonstrate at least 10 degrees of L ankle DF AROM in order to facilitate improved tolerance to functional movements such as squatting/walking.  Baseline: see ROM chart above Goal status: INITIAL   3.  Pt will demonstrate 5/5 L ankle MMT in order to demonstrate improved strength for functional WB. Baseline: see MMT chart above Goal status: INITIAL   4.  Pt will report/demonstrate ability to stand/walk for up to 36mn on even/uneven surfaces with less than 2/10 pain on NPS in order to facilitate improved tolerance to community ambulation.  Baseline: 5/10 pain with prolonged WB/ambulation, difficulty with uneven surfaces Goal status: INITIA   5. Pt will demonstrate ability to perform full squat/crouch with less than 2/10 pain on NPS in order to promote tolerance to household/recreational activities.  Baseline: Pain w/ crouching, up to 5/10 reported Goal status: INITIAL     PLAN:   PT FREQUENCY: 2x/week   PT DURATION: 6 weeks   PLANNED INTERVENTIONS: Therapeutic exercises, Therapeutic activity, Neuromuscular re-education, Balance training, Gait training, Patient/Family education, Self Care, Joint mobilization, Joint manipulation, Stair training, Aquatic Therapy, Dry Needling, Electrical stimulation, Cryotherapy, Moist heat, Taping, Manual therapy, and Re-evaluation   PLAN FOR NEXT SESSION:  Review/update HEP. Incorporate more functional movements (STS, step ups)   DLeeroy ChaPT, DPT 10/12/2022 11:57 AM    Addendum 12/01/22:   PHYSICAL THERAPY DISCHARGE  SUMMARY  Visits from Start of Care: 6  Current functional level related to goals / functional outcomes: Unable to assess   Remaining deficits: Unknown - see above for most recent assessment    Education / Equipment: Unable to assess   Patient unable to agree to discharge due to lack of follow up. Patient goals were  unable to be assessed . Patient  is being discharged due to not returning since the last visit.   Leeroy Cha PT, DPT 12/01/2022 2:31 PM

## 2022-10-15 ENCOUNTER — Ambulatory Visit: Payer: Medicare Other | Admitting: Physical Therapy

## 2022-11-17 ENCOUNTER — Ambulatory Visit (INDEPENDENT_AMBULATORY_CARE_PROVIDER_SITE_OTHER): Payer: Medicare Other | Admitting: Orthopaedic Surgery

## 2022-11-17 ENCOUNTER — Ambulatory Visit (INDEPENDENT_AMBULATORY_CARE_PROVIDER_SITE_OTHER): Payer: Medicare Other

## 2022-11-17 ENCOUNTER — Encounter: Payer: Self-pay | Admitting: Orthopaedic Surgery

## 2022-11-17 DIAGNOSIS — M25572 Pain in left ankle and joints of left foot: Secondary | ICD-10-CM

## 2022-11-17 MED ORDER — DICLOFENAC SODIUM 75 MG PO TBEC: 75.0000 mg | DELAYED_RELEASE_TABLET | Freq: Two times a day (BID) | ORAL | 2 refills | Status: AC | PRN

## 2022-11-17 NOTE — Progress Notes (Signed)
Office Visit Note   Patient: [name]           Date of Birth: 03-Mar-1991           MRN: [id number] Visit Date: 11/17/2022              Requested by: Demetrios Isaacs, [address] [place],  [place] [address] PCP: Demetrios Isaacs, FNP   Assessment & Plan: Visit Diagnoses:  1. Pain in left ankle and joints of left foot     Plan: Impression is left ankle peroneal tendinitis.  At this point, I feel as though patient needs immobilization in a cam boot to settle things down.  She will wean out of this as tolerated.  I have also started her on an anti-inflammatory.  If her symptoms do not improve over the next several weeks she will let us know we will obtain MRI of the left ankle to assess for structural abnormalities.  Otherwise, follow-up as needed.  Follow-Up Instructions: Return if symptoms worsen or fail to improve.   Orders:  Orders Placed This Encounter  Procedures   XR Ankle Complete Left   Meds ordered this encounter  Medications   diclofenac (VOLTAREN) 75 MG EC tablet    Sig: Take 1 tablet (75 mg total) by mouth 2 (two) times daily as needed.    Dispense:  60 tablet    Refill:  2      Procedures: No procedures performed   Clinical Data: No additional findings.   Subjective: Chief Complaint  Patient presents with   Left Ankle - Pain    HPI patient is a 32 year old female who comes in today with left ankle pain.  She notes that she sprained her ankle back in August of last year.  She did not initially do anything for the first month but then was seen in the urgent care setting where she was placed in an ASO.  She had persistent pain and her PCP referred her to physical therapy.  She was doing okay but noted she had increased pain to the medial lateral ankle when doing resisted band exercises.  She is here today for further evaluation and treatment recommendation.  The pain she is having is primarily to the lateral ankle.  This is associated  with swelling which also occurs at the end of the day after she has been on her feet.  She notes that the ASO brace helps a little with the swelling.  She has been taking Tylenol and anti-inflammatories with some relief.  Review of Systems as detailed in HPI.  All others reviewed and are negative.   Objective: Vital Signs: There were no vitals taken for this visit.  Physical Exam well-developed well-nourished female no acute distress.  Alert and oriented x 3.  Ortho Exam left ankle exam shows no swelling.  She has mild to moderate tenderness along the peroneal tendon.  There is no tendon subluxation.  No tenderness to the medial ankle worried ATFL.  Painless range of motion.  She is neurovascular intact distally.  Specialty Comments:  No specialty comments available.  Imaging: XR Ankle Complete Left  Result Date: 11/17/2022 No acute or structural abnormalities    PMFS History: Patient Active Problem List   Diagnosis Date Noted   Schizoaffective disorder ([place]) 07/27/2022   HGSIL (high grade squamous intraepithelial lesion) on Pap smear of cervix 08/22/2016   DISTURBANCE OF SKIN SENSATION 10/23/2010   BIPOLAR DISORDER UNSPECIFIED 02/25/2010  POST TRAUMATIC STRESS SYNDROME 02/25/2010   ATTENTION DEFICIT DISORDER 02/25/2010   ALLERGIC RHINITIS 02/25/2010   ASTHMA 02/25/2010   Past Medical History:  Diagnosis Date   Asthma    Depression    Nerve pain    hx shingles   PTSD (post-traumatic stress disorder)    Shingles 2010    Family History  Problem Relation Age of Onset   Hepatitis Mother        C   Hepatitis Sister    Diabetes Sister     Past Surgical History:  Procedure Laterality Date   INCISE AND DRAIN ABCESS     Social History   Occupational History   Not on file  Tobacco Use   Smoking status: Former    Passive exposure: Current   Smokeless tobacco: Never  Substance and Sexual Activity   Alcohol use: Not Currently    Comment: occasionally   Drug use: No    Sexual activity: Yes    Birth control/protection: Pill

## 2022-12-14 ENCOUNTER — Other Ambulatory Visit: Payer: Self-pay

## 2022-12-14 ENCOUNTER — Telehealth: Payer: Self-pay | Admitting: Orthopaedic Surgery

## 2022-12-14 DIAGNOSIS — M25572 Pain in left ankle and joints of left foot: Secondary | ICD-10-CM

## 2022-12-14 NOTE — Telephone Encounter (Signed)
Order placed

## 2022-12-14 NOTE — Telephone Encounter (Signed)
Patient requesting an MRI

## 2022-12-30 ENCOUNTER — Ambulatory Visit
Admission: RE | Admit: 2022-12-30 | Discharge: 2022-12-30 | Disposition: A | Discharge status: Home / Self Care | Payer: Medicare Other | Source: Ambulatory Visit | Attending: Orthopaedic Surgery | Admitting: Orthopaedic Surgery

## 2022-12-30 DIAGNOSIS — M25572 Pain in left ankle and joints of left foot: Secondary | ICD-10-CM

## 2023-01-05 ENCOUNTER — Other Ambulatory Visit (HOSPITAL_COMMUNITY)
Admission: RE | Admit: 2023-01-05 | Discharge: 2023-01-05 | Disposition: A | Discharge status: Home / Self Care | Payer: Medicare Other | Source: Ambulatory Visit | Attending: Obstetrics and Gynecology | Admitting: Obstetrics and Gynecology

## 2023-01-05 ENCOUNTER — Ambulatory Visit (INDEPENDENT_AMBULATORY_CARE_PROVIDER_SITE_OTHER): Payer: Medicare Other | Admitting: Obstetrics and Gynecology

## 2023-01-05 ENCOUNTER — Encounter: Payer: Self-pay | Admitting: Obstetrics and Gynecology

## 2023-01-05 ENCOUNTER — Telehealth: Payer: Self-pay

## 2023-01-05 ENCOUNTER — Ambulatory Visit (INDEPENDENT_AMBULATORY_CARE_PROVIDER_SITE_OTHER): Payer: Medicare Other | Admitting: Orthopaedic Surgery

## 2023-01-05 ENCOUNTER — Other Ambulatory Visit: Payer: Self-pay

## 2023-01-05 VITALS — BP 128/83 | HR 73 | Wt 215.2 lb

## 2023-01-05 DIAGNOSIS — R87610 Atypical squamous cells of undetermined significance on cytologic smear of cervix (ASC-US): Secondary | ICD-10-CM | POA: Insufficient documentation

## 2023-01-05 DIAGNOSIS — M25572 Pain in left ankle and joints of left foot: Secondary | ICD-10-CM | POA: Diagnosis not present

## 2023-01-05 DIAGNOSIS — R87613 High grade squamous intraepithelial lesion on cytologic smear of cervix (HGSIL): Secondary | ICD-10-CM | POA: Diagnosis not present

## 2023-01-05 DIAGNOSIS — Z01812 Encounter for preprocedural laboratory examination: Secondary | ICD-10-CM

## 2023-01-05 DIAGNOSIS — Z01411 Encounter for gynecological examination (general) (routine) with abnormal findings: Secondary | ICD-10-CM | POA: Insufficient documentation

## 2023-01-05 DIAGNOSIS — Z1151 Encounter for screening for human papillomavirus (HPV): Secondary | ICD-10-CM | POA: Diagnosis not present

## 2023-01-05 LAB — POCT PREGNANCY, URINE: Preg Test, Ur: NEGATIVE

## 2023-01-05 MED ORDER — IBUPROFEN 800 MG PO TABS
800.0000 mg | ORAL_TABLET | Freq: Once | ORAL | Status: AC
Start: 2023-01-05 — End: 2023-01-05
  Administered 2023-01-05: 800 mg via ORAL

## 2023-01-05 NOTE — Progress Notes (Signed)
    GYNECOLOGY VISIT  Patient name: [name] MRN [id number]  Date of birth: [birth year] Chief Complaint:   Abnormal Pap Smear  History:  [name] is a 32 y.o. G0P0 being seen today for follow up after LEEP. Doing well.   04/2022: HR HPV pos, HSIL 06/2022: LEEP, HSIL CIN 2-3 12-3 oclock quadrant, all other quadrants inc 1, positive margin in 12-3 quadrant .    Past Medical History:  Diagnosis Date   Asthma    Depression    Nerve pain    hx shingles   PTSD (post-traumatic stress disorder)    Shingles 2010    Past Surgical History:  Procedure Laterality Date   INCISE AND DRAIN ABCESS      The following portions of the patient's history were reviewed and updated as appropriate: allergies, current medications, past family history, past medical history, past social history, past surgical history and problem list.   Review of Systems:  Pertinent items are noted in HPI. Comprehensive review of systems was otherwise negative.   Objective:  Physical Exam BP 128/83   Pulse 73   Wt 215 lb 3.2 oz (97.6 kg) Comment: has a boot on left foot  LMP 12/18/2022 (Approximate)   BMI 31.78 kg/m    Physical Exam Vitals and nursing note reviewed. Exam conducted with a chaperone present.  Constitutional:      Appearance: Normal appearance.  HENT:     Head: Normocephalic and atraumatic.  Pulmonary:     Effort: Pulmonary effort is normal.     Breath sounds: Normal breath sounds.  Genitourinary:    General: Normal vulva.     Exam position: Lithotomy position.     Vagina: Normal.     Cervix: Normal.     Comments: Well healed with cervix, tender with pap collection  Skin:    General: Skin is warm and dry.  Neurological:     General: No focal deficit present.     Mental Status: She is alert.  Psychiatric:        Mood and Affect: Mood normal.        Behavior: Behavior normal.        Thought Content: Thought content normal.        Judgment: Judgment normal.         Assessment & Plan:   1. HGSIL (high grade squamous intraepithelial lesion) on Pap smear of cervix Post procedure pap collected with ECC. Reviewed that pap determines next steps. Initially offered colpo at this visit given positive margins, patient declined. All questions answered - Cytology - PAP - Surgical pathology( [facility]/ [facility])   Routine preventative health maintenance measures emphasized.  Lorriane Shire, MD [facility], [facility]

## 2023-01-05 NOTE — Progress Notes (Signed)
Office Visit Note   Patient: [name]           Date of Birth: 1990/11/20           MRN: [id number] Visit Date: 01/05/2023              Requested by: Donato Schultz, FNP [address] [place],  [place] [address] PCP: Donato Schultz, FNP   Assessment & Plan: Visit Diagnoses:  1. Pain in left ankle and joints of left foot     Plan: MRI scan is consistent with high-grade partial tear of the ATFL.  She does not have any instability objectively or subjectively.  I would recommend continuing with PT for ankle strengthening.  She may wean out of the cam boot and into an ASO.  Follow-up as needed.  Follow-Up Instructions: No follow-ups on file.   Orders:  No orders of the defined types were placed in this encounter.  No orders of the defined types were placed in this encounter.     Procedures: No procedures performed   Clinical Data: No additional findings.   Subjective: Chief Complaint  Patient presents with   Left Ankle - Follow-up    HPI  Patient returns today to discuss left ankle MRI scan.  Review of Systems  Constitutional: Negative.   HENT: Negative.    Eyes: Negative.   Respiratory: Negative.    Cardiovascular: Negative.   Endocrine: Negative.   Musculoskeletal: Negative.   Neurological: Negative.   Hematological: Negative.   Psychiatric/Behavioral: Negative.    All other systems reviewed and are negative.    Objective: Vital Signs: LMP 12/18/2022 (Approximate)   Physical Exam Vitals and nursing note reviewed.  Constitutional:      Appearance: She is well-developed.  HENT:     Head: Atraumatic.     Nose: Nose normal.  Eyes:     Extraocular Movements: Extraocular movements intact.  Cardiovascular:     Pulses: Normal pulses.  Pulmonary:     Effort: Pulmonary effort is normal.  Abdominal:     Palpations: Abdomen is soft.  Musculoskeletal:     Cervical back: Neck supple.  Skin:    General: Skin is warm.      Capillary Refill: Capillary refill takes less than 2 seconds.  Neurological:     Mental Status: She is alert. Mental status is at baseline.  Psychiatric:        Behavior: Behavior normal.        Thought Content: Thought content normal.        Judgment: Judgment normal.     Ortho Exam  Examination of left ankle shows tenderness to the ATFL.  There is no instability with anterior drawer test.  Specialty Comments:  No specialty comments available.  Imaging: No results found.   PMFS History: Patient Active Problem List   Diagnosis Date Noted   Schizoaffective disorder 07/27/2022   HGSIL (high grade squamous intraepithelial lesion) on Pap smear of cervix 08/22/2016   DISTURBANCE OF SKIN SENSATION 10/23/2010   BIPOLAR DISORDER UNSPECIFIED 02/25/2010   POST TRAUMATIC STRESS SYNDROME 02/25/2010   ATTENTION DEFICIT DISORDER 02/25/2010   ALLERGIC RHINITIS 02/25/2010   ASTHMA 02/25/2010   Past Medical History:  Diagnosis Date   Asthma    Depression    Nerve pain    hx shingles   PTSD (post-traumatic stress disorder)    Shingles 2010    Family History  Problem Relation Age of Onset   Hepatitis Mother  C   Hepatitis Sister    Diabetes Sister     Past Surgical History:  Procedure Laterality Date   INCISE AND DRAIN ABCESS     Social History   Occupational History   Not on file  Tobacco Use   Smoking status: Former    Passive exposure: Current   Smokeless tobacco: Never  Substance and Sexual Activity   Alcohol use: Not Currently    Comment: occasionally   Drug use: No   Sexual activity: Yes    Birth control/protection: Pill

## 2023-01-05 NOTE — Telephone Encounter (Signed)
Patient called nurse line stating that she had questions regarding future hysterectomy which she discussed with provider today at her appointment. Patient requested that provider call her back so she can ask her questions.   Maureen Ralphs RN on 01/05/23 at [phone]

## 2023-01-06 NOTE — Telephone Encounter (Signed)
Left message for patient to call back to advise her of Dr Fabio Neighbors note to wait until pap results are in for further evaluation.

## 2023-01-07 LAB — SURGICAL PATHOLOGY

## 2023-01-11 ENCOUNTER — Telehealth: Payer: Self-pay | Admitting: Lactation Services

## 2023-01-11 LAB — CYTOLOGY - PAP
Comment: NEGATIVE
Diagnosis: UNDETERMINED — AB
High risk HPV: NEGATIVE

## 2023-01-11 NOTE — Telephone Encounter (Signed)
-----   Message from Lorriane Shire, MD sent at 01/07/2023  4:48 PM EDT ----- Notify patient that endocervical sample was benign (no abnormal cells). Her pap is still pending.

## 2023-01-11 NOTE — Telephone Encounter (Signed)
Called and spoke with patient. Informed her that endocervical cells samples were normal with no abnormal cells noted. Reviewed with patient that Pap Smear is not resulted yet and she will be called with any abnormal results. Patient voiced understanding.

## 2023-01-12 ENCOUNTER — Telehealth: Payer: Self-pay

## 2023-01-12 NOTE — Telephone Encounter (Addendum)
-----   Message from Lorriane Shire, MD sent at 01/12/2023  7:55 AM EDT ----- Notify that pap smear shows slightly abnormal cells but no HPV which is great. Recommend a repeat pap in 1 year  Left message for pt to return call to the office in regards to results and f/u.    Leonette Nutting

## 2023-01-13 NOTE — Telephone Encounter (Signed)
Called patient with results of Pap Smear, she did not answer. LM for her to call the office at [phone] for non urgent results and to check her My Chart message.

## 2023-01-21 ENCOUNTER — Other Ambulatory Visit: Payer: Self-pay

## 2023-01-21 DIAGNOSIS — Z01419 Encounter for gynecological examination (general) (routine) without abnormal findings: Secondary | ICD-10-CM

## 2023-01-21 DIAGNOSIS — Z3041 Encounter for surveillance of contraceptive pills: Secondary | ICD-10-CM

## 2023-01-21 MED ORDER — NORGESTIMATE-ETH ESTRADIOL 0.25-35 MG-MCG PO TABS
1.0000 | ORAL_TABLET | Freq: Every day | ORAL | 3 refills | Status: AC
Start: 2023-01-21 — End: ?

## 2023-01-21 NOTE — Progress Notes (Signed)
Pt called and left VM today. Call placed back to pt. Spoke with pt. Pt given results of pap smear and sent in new Rx refills for 1 year of BC. Saw Dr Briscoe Deutscher on 01/05/23 for annual exam. Pt states ended period yesterday.  Pt agreeable to plan of care for repeat pap for next year in April 2025. Will set up recall.  Judeth Cornfield, RN C

## 2023-02-08 NOTE — Therapy (Deleted)
OUTPATIENT PHYSICAL THERAPY LOWER EXTREMITY EVALUATION   Patient Name: [name] MRN: [id number] DOB:[birth year], 32 y.o., female Today's Date: 02/08/2023  END OF SESSION:   Past Medical History:  Diagnosis Date   Asthma    Depression    Nerve pain    hx shingles   PTSD (post-traumatic stress disorder)    Shingles 2010   Past Surgical History:  Procedure Laterality Date   INCISE AND DRAIN ABCESS     Patient Active Problem List   Diagnosis Date Noted   Schizoaffective disorder ([facility]) 07/27/2022   HGSIL (high grade squamous intraepithelial lesion) on Pap smear of cervix 08/22/2016   DISTURBANCE OF SKIN SENSATION 10/23/2010   BIPOLAR DISORDER UNSPECIFIED 02/25/2010   POST TRAUMATIC STRESS SYNDROME 02/25/2010   ATTENTION DEFICIT DISORDER 02/25/2010   ALLERGIC RHINITIS 02/25/2010   ASTHMA 02/25/2010    PCP: Donato Schultz, FNP  REFERRING PROVIDER: Donato Schultz, FNP  REFERRING DIAG: [phone] (ICD-10-CM) - Pain in left ankle and joints of left foot  THERAPY DIAG:  No diagnosis found.  Rationale for Evaluation and Treatment: Rehabilitation  ONSET DATE: ***  SUBJECTIVE:   SUBJECTIVE STATEMENT: ***  PERTINENT HISTORY: Plan: MRI scan is consistent with high-grade partial tear of the ATFL.  She does not have any instability objectively or subjectively.  I would recommend continuing with PT for ankle strengthening.  She may wean out of the cam boot and into an ASO.  Follow-up as needed. PAIN:  Are you having pain? {OPRCPAIN:27236}  PRECAUTIONS: Other: high grade L ATL tear  WEIGHT BEARING RESTRICTIONS: No  FALLS:  Has patient fallen in last 6 months? No  OCCUPATION: ***  PLOF: Independent  PATIENT GOALS: ***  NEXT MD VISIT: ***  OBJECTIVE:   DIAGNOSTIC FINDINGS: IMPRESSION: Findings consistent with a high-grade partial tear of the ATFL from the talus. The exam is otherwise negative.     Electronically Signed   By: Drusilla Kanner  M.D.   On: 01/01/2023 09:49  PATIENT SURVEYS:  FOTO ***  MUSCLE LENGTH: Hamstrings: Right *** deg; Left *** deg Maisie Fus test: Right *** deg; Left *** deg  POSTURE: {posture:25561}  PALPATION: ***  LOWER EXTREMITY ROM:  {AROM/PROM:27142} ROM Right eval Left eval  Hip flexion    Hip extension    Hip abduction    Hip adduction    Hip internal rotation    Hip external rotation    Knee flexion    Knee extension    Ankle dorsiflexion    Ankle plantarflexion    Ankle inversion    Ankle eversion     (Blank rows = not tested)  LOWER EXTREMITY MMT:  MMT Right eval Left eval  Hip flexion    Hip extension    Hip abduction    Hip adduction    Hip internal rotation    Hip external rotation    Knee flexion    Knee extension    Ankle dorsiflexion    Ankle plantarflexion    Ankle inversion    Ankle eversion     (Blank rows = not tested)  LOWER EXTREMITY SPECIAL TESTS:  {LEspecialtests:26242}  FUNCTIONAL TESTS:  {Functional tests:24029}  GAIT: Distance walked: *** Assistive device utilized: {Assistive devices:23999} Level of assistance: {Levels of assistance:24026} Comments: ***   TODAY'S TREATMENT:  DATE: ***    PATIENT EDUCATION:  Education details: Discussed eval findings, rehab rationale and POC and patient is in agreement  Person educated: Patient Education method: Explanation Education comprehension: verbalized understanding and needs further education  HOME EXERCISE PROGRAM: ***  ASSESSMENT:  CLINICAL IMPRESSION: Patient is a *** y.o. *** who was seen today for physical therapy evaluation and treatment for ***.   OBJECTIVE IMPAIRMENTS: {opptimpairments:25111}.   ACTIVITY LIMITATIONS: {activitylimitations:27494}  PARTICIPATION LIMITATIONS: {participationrestrictions:25113}  PERSONAL FACTORS: {Personal factors:25162}  are also affecting patient's functional outcome.   REHAB POTENTIAL: Fair based on chronicty, unsuccessful conservative treatment   CLINICAL DECISION MAKING: Evolving/moderate complexity  EVALUATION COMPLEXITY: Low   GOALS: Goals reviewed with patient? {yes/no:20286}  SHORT TERM GOALS: Target date: *** *** Baseline: Goal status: {GOALSTATUS:25110}  2.  *** Baseline:  Goal status: {GOALSTATUS:25110}  3.  *** Baseline:  Goal status: {GOALSTATUS:25110}  4.  *** Baseline:  Goal status: {GOALSTATUS:25110}  5.  *** Baseline:  Goal status: {GOALSTATUS:25110}  6.  *** Baseline:  Goal status: {GOALSTATUS:25110}  LONG TERM GOALS: Target date: ***  *** Baseline:  Goal status: {GOALSTATUS:25110}  2.  *** Baseline:  Goal status: {GOALSTATUS:25110}  3.  *** Baseline:  Goal status: {GOALSTATUS:25110}  4.  *** Baseline:  Goal status: {GOALSTATUS:25110}  5.  *** Baseline:  Goal status: {GOALSTATUS:25110}  6.  *** Baseline:  Goal status: {GOALSTATUS:25110}   PLAN:  PT FREQUENCY: {rehab frequency:25116}  PT DURATION: {rehab duration:25117}  PLANNED INTERVENTIONS: {rehab planned interventions:25118::"Therapeutic exercises","Therapeutic activity","Neuromuscular re-education","Balance training","Gait training","Patient/Family education","Self Care","Joint mobilization"}  PLAN FOR NEXT SESSION: HEP review and update, manual techniques as appropriate, aerobic tasks, ROM and flexibility activities, strengthening and PREs, TPDN, gait and balance training as needed     Hildred Laser, PT 02/08/2023, 2:34 PM

## 2023-02-09 ENCOUNTER — Ambulatory Visit: Payer: Medicare Other | Admitting: Physical Therapy

## 2023-02-12 ENCOUNTER — Ambulatory Visit: Payer: Medicare Other

## 2023-03-02 ENCOUNTER — Ambulatory Visit: Payer: Medicare Other

## 2023-03-23 ENCOUNTER — Ambulatory Visit: Payer: Medicare Other | Attending: Family | Admitting: Physical Therapy

## 2023-03-23 ENCOUNTER — Encounter: Payer: Self-pay | Admitting: Physical Therapy

## 2023-03-23 ENCOUNTER — Other Ambulatory Visit: Payer: Self-pay

## 2023-03-23 DIAGNOSIS — M6281 Muscle weakness (generalized): Secondary | ICD-10-CM | POA: Insufficient documentation

## 2023-03-23 DIAGNOSIS — M25572 Pain in left ankle and joints of left foot: Secondary | ICD-10-CM | POA: Diagnosis present

## 2023-03-23 DIAGNOSIS — R600 Localized edema: Secondary | ICD-10-CM | POA: Insufficient documentation

## 2023-03-23 NOTE — Therapy (Signed)
OUTPATIENT PHYSICAL THERAPY LOWER EXTREMITY EVALUATION  Patient Name: [name] MRN: [id number] DOB:[birth year], 32 y.o., female Today's Date: 03/23/2023   PT End of Session - 03/23/23 1444     Visit Number 1    Number of Visits --   1-2x/week   Date for PT Re-Evaluation 05/18/23    Authorization Type MCR/MCD - FOTO    Progress Note Due on Visit 10    PT Start Time 0200    PT Stop Time 0238    PT Time Calculation (min) 38 min             Past Medical History:  Diagnosis Date   Asthma    Depression    Nerve pain    hx shingles   PTSD (post-traumatic stress disorder)    Shingles 2010   Past Surgical History:  Procedure Laterality Date   INCISE AND DRAIN ABCESS     Patient Active Problem List   Diagnosis Date Noted   Schizoaffective disorder (HCC) 07/27/2022   HGSIL (high grade squamous intraepithelial lesion) on Pap smear of cervix 08/22/2016   DISTURBANCE OF SKIN SENSATION 10/23/2010   BIPOLAR DISORDER UNSPECIFIED 02/25/2010   POST TRAUMATIC STRESS SYNDROME 02/25/2010   ATTENTION DEFICIT DISORDER 02/25/2010   ALLERGIC RHINITIS 02/25/2010   ASTHMA 02/25/2010    PCP: Donato Schultz, FNP  REFERRING PROVIDER: Donato Schultz, FNP  THERAPY DIAG:  Pain in left ankle and joints of left foot  Muscle weakness  Localized edema  REFERRING DIAG: Pain in left ankle and joints of left foot [M25.572]   Rationale for Evaluation and Treatment:  Rehabilitation  SUBJECTIVE:  PERTINENT PAST HISTORY:  Bipolar, PTSD, schizoaffective disorder        PRECAUTIONS: None  WEIGHT BEARING RESTRICTIONS No  FALLS:  Has patient fallen in last 6 months? No, Number of falls: 0  MOI/History of condition:  Onset date: August or September of 2023  SUBJECTIVE STATEMENT  [name] is a 32 y.o. female who presents to clinic with chief complaint of L ankle pain and mild swelling which started when she rolled her ankle in mid 2023.  She underwent PT late  2023 which was somewhat helpful, but caused an increase in swelling per pt.  MRI shows tear of ATFL.  She was in a boot for 6 weeks ending in April.  She is unsure if this was helpful.     Red flags:  none  Pain:  Are you having pain? Yes Pain location: lateral NPRS scale:  2/10 to 5/10 Aggravating factors: walking, standing Relieving factors: rest, ice Pain description: aching Stage: Chronic Stability: getting better  Patient Goals/Specific Activities: reduce "tight feeling", reduce swelling, and pain.  Walk up hills    OBJECTIVE:   DIAGNOSTIC FINDINGS:  L ankle MRI: IMPRESSION: Findings consistent with a high-grade partial tear of the ATFL from the talus. The exam is otherwise negative.   GENERAL OBSERVATION/GAIT:   Mild toe walking gait  SENSATION:  Light touch: Appears intact   LE MMT:  MMT Right (Eval) Left (Eval)  Hip flexion (L2, L3) 4 4  Knee extension (L3) 4 4  Knee flexion 4 4  Hip abduction    Hip extension    Hip external rotation    Hip internal rotation    Hip adduction    Ankle dorsiflexion (L4) 4x 5x  Ankle plantarflexion (S1) n n  Ankle inversion n n  Ankle eversion n n  Great Toe ext (L5)  Grossly     (Blank rows = not tested, score listed is out of 5 possible points.  N = WNL, D = diminished, C = clear for gross weakness with myotome testing, * = concordant pain with testing)  LE ROM:  ROM Right (Eval) Left (Eval)  Hip flexion    Hip extension    Hip abduction    Hip adduction    Hip internal rotation    Hip external rotation    Knee flexion    Knee extension    Ankle dorsiflexion 36 cc 30cc   Ankle plantarflexion n n  Ankle inversion n n  Ankle eversion n n   (Blank rows = not tested, N = WNL, * = concordant pain with testing)  Functional Tests  Eval (03/23/2023)    Progressive balance screen (highest level completed for >/= 10''):  Feet together: 10'' Semi Tandem: R in rear 10'', L in rear 10'' Tandem: R in rear  10'', L in rear 10'' SLS: R 10'', L 10'' SLS on foam: R 9'', L <3''                                                           PATIENT SURVEYS:  FOTO 59 -> 70   TODAY'S TREATMENT: Creating, reviewing, and completing below HEP   PATIENT EDUCATION:  POC, diagnosis, prognosis, HEP, and outcome measures.  Pt educated via explanation, demonstration, and handout (HEP).  Pt confirms understanding verbally.   HOME EXERCISE PROGRAM: Access Code: [email or web address] URL: [email or web address] Date: 03/23/2023 Prepared by: Alphonzo Severance  Exercises - Seated Toe Towel Scrunches  - 1 x daily - 7 x weekly - 3 sets - 10 reps - Seated Ankle Plantarflexion with Resistance  - 1 x daily - 7 x weekly - 3 sets - 10 reps - Long Sitting Ankle Dorsiflexion with Anchored Resistance  - 1 x daily - 7 x weekly - 3 sets - 10 reps - Seated Figure 4 Ankle Inversion with Resistance  - 1 x daily - 7 x weekly - 3 sets - 10 reps - Seated Ankle Eversion with Resistance  - 1 x daily - 7 x weekly - 3 sets - 10 reps - Gastroc Stretch on Wall  - 1 x daily - 7 x weekly - 1 sets - 3 reps - 45 seconds hold  Treatment priorities   Eval        Balance on unstable surfaces/ EC?        DF ROM                                  ASSESSMENT:  CLINICAL IMPRESSION: [name] is a 32 y.o. female who presents to clinic with signs and sxs consistent with R ankle pain secondary to ankle sprain in August/September 2023. MRI shows tear of ATFL. Pt underwent PT late 2023 but stopped d/t swelling.  She is still having intermittent swelling but this is improved.  OBJECTIVE IMPAIRMENTS: Pain, balance, gait, ankle strength, ankle DF ROM  ACTIVITY LIMITATIONS: standing, walking, [place]  PERSONAL FACTORS: See medical history and pertinent history   REHAB POTENTIAL: Good  CLINICAL DECISION MAKING: Stable/uncomplicated  EVALUATION COMPLEXITY: Low   GOALS:   SHORT TERM  GOALS:  Target date: 04/20/2023   Wing will be >75% HEP compliant to improve carryover between sessions and facilitate independent management of condition  Evaluation: ongoing Goal status: INITIAL   LONG TERM GOALS: Target date: 05/18/2023   [name] will improve FOTO score to 70 as a proxy for functional improvement  Evaluation/Baseline: 59 Goal status: INITIAL   2.  [name] will self report >/= 50% decrease in pain from evaluation   Evaluation/Baseline: 5/10 max pain Goal status: INITIAL   3.  Aneira will improve left closed chain ankle DF to 35 degrees (norm is ~40 degrees)   Evaluation/Baseline: 30 degrees Goal status: INITIAL   4.  [name] will report confidence in self management of condition at time of discharge with advanced HEP  Evaluation/Baseline: unable to self manage Goal status: INITIAL    5.  [name] will be able to [place], not [place] by pain  Evaluation/Baseline: limited Goal status: INITIAL   PLAN: PT FREQUENCY: 1-2x/week  PT DURATION: 8 weeks  PLANNED INTERVENTIONS: Therapeutic exercises, Aquatic therapy, Therapeutic activity, Neuro Muscular re-education, Gait training, Patient/Family education, Joint mobilization, Dry Needling, Electrical stimulation, Spinal mobilization and/or manipulation, Moist heat, Taping, Vasopneumatic device, Ionotophoresis 4mg /ml Dexamethasone, and Manual therapy   Alphonzo Severance PT, DPT 03/23/2023, 2:45 PM

## 2023-04-09 ENCOUNTER — Encounter: Payer: Self-pay | Admitting: Physical Therapy

## 2023-04-09 ENCOUNTER — Ambulatory Visit: Payer: Medicare Other | Attending: Family | Admitting: Physical Therapy

## 2023-04-09 ENCOUNTER — Encounter: Payer: Medicare Other | Admitting: Physical Therapy

## 2023-04-09 DIAGNOSIS — R600 Localized edema: Secondary | ICD-10-CM | POA: Diagnosis present

## 2023-04-09 DIAGNOSIS — M6281 Muscle weakness (generalized): Secondary | ICD-10-CM | POA: Diagnosis present

## 2023-04-09 DIAGNOSIS — M25672 Stiffness of left ankle, not elsewhere classified: Secondary | ICD-10-CM | POA: Insufficient documentation

## 2023-04-09 DIAGNOSIS — R262 Difficulty in walking, not elsewhere classified: Secondary | ICD-10-CM | POA: Insufficient documentation

## 2023-04-09 DIAGNOSIS — M25572 Pain in left ankle and joints of left foot: Secondary | ICD-10-CM | POA: Diagnosis present

## 2023-04-09 NOTE — Therapy (Signed)
OUTPATIENT PHYSICAL THERAPY LOWER EXTREMITY EVALUATION  Patient Name: [name] MRN: [id number] DOB:1990/11/21, 32 y.o., female Today's Date: 04/09/2023   PT End of Session - 04/09/23 1120     Visit Number 2    Number of Visits --   1-2x/week   Date for PT Re-Evaluation 05/18/23    Authorization Type MCR/MCD - FOTO    Progress Note Due on Visit 10    PT Start Time 1120    PT Stop Time 1200    PT Time Calculation (min) 40 min             Past Medical History:  Diagnosis Date   Asthma    Depression    Nerve pain    hx shingles   PTSD (post-traumatic stress disorder)    Shingles 2010   Past Surgical History:  Procedure Laterality Date   INCISE AND DRAIN ABCESS     Patient Active Problem List   Diagnosis Date Noted   Schizoaffective disorder (HCC) 07/27/2022   HGSIL (high grade squamous intraepithelial lesion) on Pap smear of cervix 08/22/2016   DISTURBANCE OF SKIN SENSATION 10/23/2010   BIPOLAR DISORDER UNSPECIFIED 02/25/2010   POST TRAUMATIC STRESS SYNDROME 02/25/2010   ATTENTION DEFICIT DISORDER 02/25/2010   ALLERGIC RHINITIS 02/25/2010   ASTHMA 02/25/2010    PCP: Donato Schultz, FNP  REFERRING PROVIDER: Donato Schultz, FNP  THERAPY DIAG:  Pain in left ankle and joints of left foot  Muscle weakness  Localized edema  REFERRING DIAG: Pain in left ankle and joints of left foot [M25.572]   Rationale for Evaluation and Treatment:  Rehabilitation  SUBJECTIVE:  PERTINENT PAST HISTORY:  Bipolar, PTSD, schizoaffective disorder        PRECAUTIONS: None  WEIGHT BEARING RESTRICTIONS No  FALLS:  Has patient fallen in last 6 months? No, Number of falls: 0  MOI/History of condition:  Onset date: August or September of 2023  SUBJECTIVE STATEMENT  Pt reports that she has been doing her exercises regularly and feels a significant improvement in her ankle pain.    Red flags:  none  Pain:  Are you having pain? Yes Pain location:  lateral NPRS scale:  2/10 to 5/10 Aggravating factors: walking, standing Relieving factors: rest, ice Pain description: aching Stage: Chronic Stability: getting better  Patient Goals/Specific Activities: reduce "tight feeling", reduce swelling, and pain.  Walk up hills    OBJECTIVE:   DIAGNOSTIC FINDINGS:  L ankle MRI: IMPRESSION: Findings consistent with a high-grade partial tear of the ATFL from the talus. The exam is otherwise negative.   GENERAL OBSERVATION/GAIT:   Mild toe walking gait  SENSATION:  Light touch: Appears intact   LE MMT:  MMT Right (Eval) Left (Eval)  Hip flexion (L2, L3) 4 4  Knee extension (L3) 4 4  Knee flexion 4 4  Hip abduction    Hip extension    Hip external rotation    Hip internal rotation    Hip adduction    Ankle dorsiflexion (L4) 4x 5x  Ankle plantarflexion (S1) n n  Ankle inversion n n  Ankle eversion n n  Great Toe ext (L5)    Grossly     (Blank rows = not tested, score listed is out of 5 possible points.  N = WNL, D = diminished, C = clear for gross weakness with myotome testing, * = concordant pain with testing)  LE ROM:  ROM Right (Eval) Left (Eval)  Hip flexion    Hip extension  Hip abduction    Hip adduction    Hip internal rotation    Hip external rotation    Knee flexion    Knee extension    Ankle dorsiflexion 36 cc 30cc   Ankle plantarflexion n n  Ankle inversion n n  Ankle eversion n n   (Blank rows = not tested, N = WNL, * = concordant pain with testing)  Functional Tests  Eval (04/09/2023)    Progressive balance screen (highest level completed for >/= 10''):  Feet together: 10'' Semi Tandem: R in rear 10'', L in rear 10'' Tandem: R in rear 10'', L in rear 10'' SLS: R 10'', L 10'' SLS on foam: R 9'', L <3''                                                           PATIENT SURVEYS:  FOTO 59 -> 70   PATIENT EDUCATION:  POC, diagnosis, prognosis, HEP, and outcome measures.  Pt  educated via explanation, demonstration, and handout (HEP).  Pt confirms understanding verbally.   HOME EXERCISE PROGRAM: Access Code: [email or web address] URL: [email or web address] Date: 04/09/2023 Prepared by: Alphonzo Severance  Exercises - Seated Toe Towel Scrunches  - 1 x daily - 7 x weekly - 3 sets - 10 reps - Seated Ankle Plantarflexion with Resistance  - 1 x daily - 7 x weekly - 3 sets - 10 reps - Seated Figure 4 Ankle Inversion with Resistance  - 1 x daily - 7 x weekly - 3 sets - 10 reps - Toe Raise With Back Against Wall  - 1 x daily - 7 x weekly - 3 sets - 10 reps - Seated Ankle Eversion with Resistance  - 1 x daily - 7 x weekly - 3 sets - 10 reps - Gastroc Stretch on Wall  - 1 x daily - 7 x weekly - 1 sets - 3 reps - 45 seconds hold  Treatment priorities   Eval        Balance on unstable surfaces/ EC?        DF ROM                                 TODAY'S TREATMENT:  [facility] Adult PT Treatment:  Therapeutic Exercise:  Bike 5 min Slant board stretch - 45'' x3 Toe raises - 3x10 Heel raises - 3x10 Step up with march - 2x15 ea - 6'' step  Neuromuscular re-ed: SLS SLS on foam Rocker board DF/PF  SLS Hip abd in SLS on foam - 2x15  ASSESSMENT:  CLINICAL IMPRESSION: Taeko tolerated session well with no adverse reaction.  Progressing well with less ankle pain and swelling.  HEP updated.  OBJECTIVE IMPAIRMENTS: Pain, balance, gait, ankle strength, ankle DF ROM  ACTIVITY LIMITATIONS: standing, walking, [place]  PERSONAL FACTORS: See medical history and pertinent history   REHAB POTENTIAL: Good  CLINICAL DECISION MAKING: Stable/uncomplicated  EVALUATION COMPLEXITY: Low   GOALS:   SHORT TERM GOALS: Target date: 04/20/2023   Klyn will be >75% HEP compliant to improve carryover between sessions and facilitate independent management of condition  Evaluation: ongoing Goal status: INITIAL   LONG TERM GOALS: Target  date: 05/18/2023   Emmiline will improve FOTO  score to 70 as a proxy for functional improvement  Evaluation/Baseline: 59 Goal status: INITIAL   2.  Shakiyah will self report >/= 50% decrease in pain from evaluation   Evaluation/Baseline: 5/10 max pain Goal status: INITIAL   3.  Krupa will improve left closed chain ankle DF to 35 degrees (norm is ~40 degrees)   Evaluation/Baseline: 30 degrees Goal status: INITIAL   4.  Brytani will report confidence in self management of condition at time of discharge with advanced HEP  Evaluation/Baseline: unable to self manage Goal status: INITIAL    5.  Yacine will be able to [place], not limited by pain  Evaluation/Baseline: limited Goal status: INITIAL   PLAN: PT FREQUENCY: 1-2x/week  PT DURATION: 8 weeks  PLANNED INTERVENTIONS: Therapeutic exercises, Aquatic therapy, Therapeutic activity, Neuro Muscular re-education, Gait training, Patient/Family education, Joint mobilization, Dry Needling, Electrical stimulation, Spinal mobilization and/or manipulation, Moist heat, Taping, Vasopneumatic device, Ionotophoresis 4mg /ml Dexamethasone, and Manual therapy   Alphonzo Severance PT, DPT 04/09/2023, 12:00 PM

## 2023-04-16 ENCOUNTER — Encounter: Payer: Medicare Other | Admitting: Physical Therapy

## 2023-04-16 ENCOUNTER — Encounter: Payer: Self-pay | Admitting: Physical Therapy

## 2023-04-16 ENCOUNTER — Ambulatory Visit: Payer: Medicare Other | Admitting: Physical Therapy

## 2023-04-16 DIAGNOSIS — M25572 Pain in left ankle and joints of left foot: Secondary | ICD-10-CM

## 2023-04-16 DIAGNOSIS — R262 Difficulty in walking, not elsewhere classified: Secondary | ICD-10-CM

## 2023-04-16 DIAGNOSIS — M6281 Muscle weakness (generalized): Secondary | ICD-10-CM

## 2023-04-16 DIAGNOSIS — M25672 Stiffness of left ankle, not elsewhere classified: Secondary | ICD-10-CM

## 2023-04-16 DIAGNOSIS — R600 Localized edema: Secondary | ICD-10-CM

## 2023-04-16 NOTE — Therapy (Unsigned)
OUTPATIENT PHYSICAL THERAPY LOWER EXTREMITY EVALUATION  Patient Name: [name] MRN: [id number] DOB:[birth year], 32 y.o., female Today's Date: 04/17/2023   PT End of Session - 04/16/23 1141     Visit Number 3    Number of Visits --   1-2x/week   Date for PT Re-Evaluation 05/18/23    Authorization Type MCR/MCD - FOTO    Progress Note Due on Visit 10    PT Start Time 1141    PT Stop Time 1219    PT Time Calculation (min) 38 min             Past Medical History:  Diagnosis Date   Asthma    Depression    Nerve pain    hx shingles   PTSD (post-traumatic stress disorder)    Shingles 2010   Past Surgical History:  Procedure Laterality Date   INCISE AND DRAIN ABCESS     Patient Active Problem List   Diagnosis Date Noted   Schizoaffective disorder (HCC) 07/27/2022   HGSIL (high grade squamous intraepithelial lesion) on Pap smear of cervix 08/22/2016   DISTURBANCE OF SKIN SENSATION 10/23/2010   BIPOLAR DISORDER UNSPECIFIED 02/25/2010   POST TRAUMATIC STRESS SYNDROME 02/25/2010   ATTENTION DEFICIT DISORDER 02/25/2010   ALLERGIC RHINITIS 02/25/2010   ASTHMA 02/25/2010    PCP: Donato Schultz, FNP  REFERRING PROVIDER: Donato Schultz, FNP  THERAPY DIAG:  Pain in left ankle and joints of left foot  Muscle weakness  Localized edema  Stiffness of left ankle, not elsewhere classified  Muscle weakness (generalized)  Difficulty in walking, not elsewhere classified  REFERRING DIAG: Pain in left ankle and joints of left foot [M25.572]   Rationale for Evaluation and Treatment:  Rehabilitation  SUBJECTIVE:  PERTINENT PAST HISTORY:  Bipolar, PTSD, schizoaffective disorder        PRECAUTIONS: None  WEIGHT BEARING RESTRICTIONS No  FALLS:  Has patient fallen in last 6 months? No, Number of falls: 0  MOI/History of condition:  Onset date: August or September of 2023  SUBJECTIVE STATEMENT  Pt reports that she is doing well.  Her ankle was a bit  sore after her PT visit last week but this was not too severe.   Red flags:  none  Pain:  Are you having pain? Yes Pain location: lateral NPRS scale:  2/10 to 5/10 Aggravating factors: walking, standing Relieving factors: rest, ice Pain description: aching Stage: Chronic Stability: getting better  Patient Goals/Specific Activities: reduce "tight feeling", reduce swelling, and pain.  Walk up hills    OBJECTIVE:   DIAGNOSTIC FINDINGS:  L ankle MRI: IMPRESSION: Findings consistent with a high-grade partial tear of the ATFL from the talus. The exam is otherwise negative.   GENERAL OBSERVATION/GAIT:   Mild toe walking gait  SENSATION:  Light touch: Appears intact   LE MMT:  MMT Right (Eval) Left (Eval)  Hip flexion (L2, L3) 4 4  Knee extension (L3) 4 4  Knee flexion 4 4  Hip abduction    Hip extension    Hip external rotation    Hip internal rotation    Hip adduction    Ankle dorsiflexion (L4) 4x 5x  Ankle plantarflexion (S1) n n  Ankle inversion n n  Ankle eversion n n  Great Toe ext (L5)    Grossly     (Blank rows = not tested, score listed is out of 5 possible points.  N = WNL, D = diminished, C = clear for gross weakness with  myotome testing, * = concordant pain with testing)  LE ROM:  ROM Right (Eval) Left (Eval)  Hip flexion    Hip extension    Hip abduction    Hip adduction    Hip internal rotation    Hip external rotation    Knee flexion    Knee extension    Ankle dorsiflexion 36 cc 30cc   Ankle plantarflexion n n  Ankle inversion n n  Ankle eversion n n   (Blank rows = not tested, N = WNL, * = concordant pain with testing)  Functional Tests  Eval (04/17/2023)    Progressive balance screen (highest level completed for >/= 10''):  Feet together: 10'' Semi Tandem: R in rear 10'', L in rear 10'' Tandem: R in rear 10'', L in rear 10'' SLS: R 10'', L 10'' SLS on foam: R 9'', L <3''                                                            PATIENT SURVEYS:  FOTO 59 -> 70   PATIENT EDUCATION:  POC, diagnosis, prognosis, HEP, and outcome measures.  Pt educated via explanation, demonstration, and handout (HEP).  Pt confirms understanding verbally.   HOME EXERCISE PROGRAM: Access Code: [email or web address] URL: [email or web address] Date: 04/09/2023 Prepared by: Alphonzo Severance  Exercises - Seated Toe Towel Scrunches  - 1 x daily - 7 x weekly - 3 sets - 10 reps - Seated Ankle Plantarflexion with Resistance  - 1 x daily - 7 x weekly - 3 sets - 10 reps - Seated Figure 4 Ankle Inversion with Resistance  - 1 x daily - 7 x weekly - 3 sets - 10 reps - Toe Raise With Back Against Wall  - 1 x daily - 7 x weekly - 3 sets - 10 reps - Seated Ankle Eversion with Resistance  - 1 x daily - 7 x weekly - 3 sets - 10 reps - Gastroc Stretch on Wall  - 1 x daily - 7 x weekly - 1 sets - 3 reps - 45 seconds hold  Treatment priorities   Eval        Balance on unstable surfaces/ EC?        DF ROM                                 TODAY'S TREATMENT:  [facility] Adult PT Treatment:  Therapeutic Exercise:  Bike 5 min Slant board stretch - 45'' x3 Lateral walking with RTB band Monster walk - RTB Heel raises on step - 3x10 - with ball squeeze Step up with march - 2x10 ea - 8'' step  Neuromuscular re-ed Hip abd in SLS on foam - 2x15 Lateral walking with heel hang on foam Tandem walking of foam beam fwd and bkwd  ASSESSMENT:  CLINICAL IMPRESSION: Tereasa tolerated session well with no adverse reaction.  Pt with some residual soreness following last session so intensity was kept roughly the same.  Pt with improving SLS balance, but with continued fatigue with current volume.  Pt with more mid portion achilles tendon pain complaint than ankle joint today; HEP updated accordingly.  OBJECTIVE IMPAIRMENTS: Pain, balance, gait, ankle strength, ankle DF ROM  ACTIVITY LIMITATIONS: standing, walking,  [place]  PERSONAL FACTORS: See medical history and pertinent history   REHAB POTENTIAL: Good  CLINICAL DECISION MAKING: Stable/uncomplicated  EVALUATION COMPLEXITY: Low   GOALS:   SHORT TERM GOALS: Target date: 04/20/2023   Titania will be >75% HEP compliant to improve carryover between sessions and facilitate independent management of condition  Evaluation: ongoing Goal status: INITIAL   LONG TERM GOALS: Target date: 05/18/2023   Dekayla will improve FOTO score to 70 as a proxy for functional improvement  Evaluation/Baseline: 59 Goal status: INITIAL   2.  Maxie will self report >/= 50% decrease in pain from evaluation   Evaluation/Baseline: 5/10 max pain Goal status: INITIAL   3.  Brianca will improve left closed chain ankle DF to 35 degrees (norm is ~40 degrees)   Evaluation/Baseline: 30 degrees Goal status: INITIAL   4.  Genesys will report confidence in self management of condition at time of discharge with advanced HEP  Evaluation/Baseline: unable to self manage Goal status: INITIAL    5.  Halina will be able to [place], not limited by pain  Evaluation/Baseline: limited Goal status: INITIAL   PLAN: PT FREQUENCY: 1-2x/week  PT DURATION: 8 weeks  PLANNED INTERVENTIONS: Therapeutic exercises, Aquatic therapy, Therapeutic activity, Neuro Muscular re-education, Gait training, Patient/Family education, Joint mobilization, Dry Needling, Electrical stimulation, Spinal mobilization and/or manipulation, Moist heat, Taping, Vasopneumatic device, Ionotophoresis 4mg /ml Dexamethasone, and Manual therapy   Alphonzo Severance PT, DPT 04/17/2023, 10:57 AM

## 2023-04-23 ENCOUNTER — Ambulatory Visit: Payer: Medicare Other | Admitting: Physical Therapy

## 2023-04-23 ENCOUNTER — Encounter: Payer: Medicare Other | Admitting: Physical Therapy

## 2023-04-30 ENCOUNTER — Encounter: Payer: Medicare Other | Admitting: Physical Therapy

## 2023-04-30 ENCOUNTER — Ambulatory Visit: Payer: Medicare Other | Admitting: Physical Therapy

## 2023-05-07 ENCOUNTER — Ambulatory Visit: Payer: Medicare Other | Admitting: Physical Therapy

## 2023-05-21 ENCOUNTER — Ambulatory Visit: Payer: Medicare Other | Attending: Family | Admitting: Physical Therapy

## 2023-05-21 ENCOUNTER — Encounter: Payer: Self-pay | Admitting: Physical Therapy

## 2023-05-21 DIAGNOSIS — M25572 Pain in left ankle and joints of left foot: Secondary | ICD-10-CM | POA: Insufficient documentation

## 2023-05-21 DIAGNOSIS — M6281 Muscle weakness (generalized): Secondary | ICD-10-CM | POA: Insufficient documentation

## 2023-05-21 DIAGNOSIS — R600 Localized edema: Secondary | ICD-10-CM | POA: Diagnosis present

## 2023-05-21 NOTE — Therapy (Signed)
PHYSICAL THERAPY DISCHARGE SUMMARY  Visits from Start of Care: 4  Current functional level related to goals / functional outcomes: See assessment/goals   Remaining deficits: See assessment/goals   Education / Equipment: HEP and D/C plans  Patient agrees to discharge. Patient goals were met. Patient is being discharged due to being pleased with the current functional level.  Patient Name: [name] MRN: [id number] DOB:[birth year], 32 y.o., female Today's Date: 05/21/2023   PT End of Session - 05/21/23 0914     Visit Number 4    Number of Visits --   1-2x/week   Date for PT Re-Evaluation 05/18/23    Authorization Type MCR/MCD - FOTO    Progress Note Due on Visit 10    PT Start Time 0915    PT Stop Time 0957    PT Time Calculation (min) 42 min             Past Medical History:  Diagnosis Date   Asthma    Depression    Nerve pain    hx shingles   PTSD (post-traumatic stress disorder)    Shingles 2010   Past Surgical History:  Procedure Laterality Date   INCISE AND DRAIN ABCESS     Patient Active Problem List   Diagnosis Date Noted   Schizoaffective disorder (HCC) 07/27/2022   HGSIL (high grade squamous intraepithelial lesion) on Pap smear of cervix 08/22/2016   DISTURBANCE OF SKIN SENSATION 10/23/2010   BIPOLAR DISORDER UNSPECIFIED 02/25/2010   POST TRAUMATIC STRESS SYNDROME 02/25/2010   ATTENTION DEFICIT DISORDER 02/25/2010   ALLERGIC RHINITIS 02/25/2010   ASTHMA 02/25/2010    PCP: Donato Schultz, FNP  REFERRING PROVIDER: Donato Schultz, FNP  THERAPY DIAG:  Pain in left ankle and joints of left foot - Plan: PT plan of care cert/re-cert  Muscle weakness - Plan: PT plan of care cert/re-cert  Localized edema - Plan: PT plan of care cert/re-cert  REFERRING DIAG: Pain in left ankle and joints of left foot [M25.572]   Rationale for Evaluation and Treatment:  Rehabilitation  SUBJECTIVE:  PERTINENT PAST HISTORY:  Bipolar, PTSD,  schizoaffective disorder        PRECAUTIONS: None  WEIGHT BEARING RESTRICTIONS No  FALLS:  Has patient fallen in last 6 months? No, Number of falls: 0  MOI/History of condition:  Onset date: August or September of 2023  SUBJECTIVE STATEMENT  Pt reports that her pain has been very low even with walking on uneven surfaces and hills.  She feels she is ready for D/C.   Red flags:  none  Pain:  Are you having pain? Yes Pain location: lateral NPRS scale:  2/10 to 5/10 Aggravating factors: walking, standing Relieving factors: rest, ice Pain description: aching Stage: Chronic Stability: getting better  Patient Goals/Specific Activities: reduce "tight feeling", reduce swelling, and pain.  Walk up hills    OBJECTIVE:   DIAGNOSTIC FINDINGS:  L ankle MRI: IMPRESSION: Findings consistent with a high-grade partial tear of the ATFL from the talus. The exam is otherwise negative.   GENERAL OBSERVATION/GAIT:   Mild toe walking gait  SENSATION:  Light touch: Appears intact   LE MMT:  MMT Right (Eval) Left (Eval)  Hip flexion (L2, L3) 4 4  Knee extension (L3) 4 4  Knee flexion 4 4  Hip abduction    Hip extension    Hip external rotation    Hip internal rotation    Hip adduction    Ankle dorsiflexion (L4) 4x 5x  Ankle plantarflexion (S1) n n  Ankle inversion n n  Ankle eversion n n  Great Toe ext (L5)    Grossly     (Blank rows = not tested, score listed is out of 5 possible points.  N = WNL, D = diminished, C = clear for gross weakness with myotome testing, * = concordant pain with testing)  LE ROM:  ROM Right (Eval) Left (Eval) L (8/23)  Hip flexion     Hip extension     Hip abduction     Hip adduction     Hip internal rotation     Hip external rotation     Knee flexion     Knee extension     Ankle dorsiflexion 36 cc 30cc  36cc  Ankle plantarflexion n n   Ankle inversion n n   Ankle eversion n n    (Blank rows = not tested, N = WNL, * =  concordant pain with testing)  Functional Tests  Eval (05/21/2023)    Progressive balance screen (highest level completed for >/= 10''):  Feet together: 10'' Semi Tandem: R in rear 10'', L in rear 10'' Tandem: R in rear 10'', L in rear 10'' SLS: R 10'', L 10'' SLS on foam: R 9'', L <3''                                                           PATIENT SURVEYS:  FOTO 59 -> 70   PATIENT EDUCATION:  POC, diagnosis, prognosis, HEP, and outcome measures.  Pt educated via explanation, demonstration, and handout (HEP).  Pt confirms understanding verbally.   HOME EXERCISE PROGRAM: Access Code: [email or web address] URL: [email or web address] Date: 04/09/2023 Prepared by: Alphonzo Severance  Exercises - Seated Toe Towel Scrunches  - 1 x daily - 7 x weekly - 3 sets - 10 reps - Seated Ankle Plantarflexion with Resistance  - 1 x daily - 7 x weekly - 3 sets - 10 reps - Seated Figure 4 Ankle Inversion with Resistance  - 1 x daily - 7 x weekly - 3 sets - 10 reps - Toe Raise With Back Against Wall  - 1 x daily - 7 x weekly - 3 sets - 10 reps - Seated Ankle Eversion with Resistance  - 1 x daily - 7 x weekly - 3 sets - 10 reps - Gastroc Stretch on Wall  - 1 x daily - 7 x weekly - 1 sets - 3 reps - 45 seconds hold  Treatment priorities   Eval        Balance on unstable surfaces/ EC?        DF ROM                                 TODAY'S TREATMENT:  [facility] Adult PT Treatment:  Therapeutic Exercise:  Bike 5 min Slant board stretch - 45'' x3 Lateral walking with RTB band Monster walk - RTB Heel raises on step - 3x10 - with ball squeeze Step up with march - 2x10 ea - 8'' step  Neuromuscular re-ed Hip abd in SLS on foam - 2x15 Lateral walking with heel hang on foam Tandem walking of foam beam fwd and bkwd  Therapeutic Activity - collecting information  for goals, checking progress, and reviewing with patient  ASSESSMENT:  CLINICAL IMPRESSION: [name]  has progressed well with therapy.  Improved impairments include: ankle ROM, strength, and pain.  Functional improvements include: ability [place] with minimal pain and swelling.  Progressions needed include: continued work at home with HEP.  Barriers to progress include: NA.  Please see GOALS section for progress on short term and long term goals established at evaluation.  I recommend D/C home with HEP; pt agrees with plan.  OBJECTIVE IMPAIRMENTS: Pain, balance, gait, ankle strength, ankle DF ROM  ACTIVITY LIMITATIONS: standing, walking, [place]  PERSONAL FACTORS: See medical history and pertinent history   REHAB POTENTIAL: Good  CLINICAL DECISION MAKING: Stable/uncomplicated  EVALUATION COMPLEXITY: Low   GOALS:   SHORT TERM GOALS: Target date: 04/20/2023   Ayra will be >75% HEP compliant to improve carryover between sessions and facilitate independent management of condition  Evaluation: ongoing Goal status: MET   LONG TERM GOALS: Target date: 05/18/2023   Gypsie will improve FOTO score to 70 as a proxy for functional improvement  Evaluation/Baseline: 59 Goal status: INITIAL   2.  Alvine will self report >/= 50% decrease in pain from evaluation   Evaluation/Baseline: 5/10 max pain 8/23: 90% Goal status: MET   3.  Kynedi will improve left closed chain ankle DF to 35 degrees (norm is ~40 degrees)   Evaluation/Baseline: 30 degrees 8/23: 36 Goal status: MET   4.  Jenan will report confidence in self management of condition at time of discharge with advanced HEP  Evaluation/Baseline: unable to self manage 8/23: MET Goal status: MET    5.  Adara will be able to navigate [place], not limited by pain  Evaluation/Baseline: limited 8/23: MET Goal status: MET   PLAN: PT FREQUENCY: 1-2x/week  PT DURATION: 8 weeks  PLANNED INTERVENTIONS:  Therapeutic exercises, Aquatic therapy, Therapeutic activity, Neuro Muscular re-education, Gait training, Patient/Family education, Joint mobilization, Dry Needling, Electrical stimulation, Spinal mobilization and/or manipulation, Moist heat, Taping, Vasopneumatic device, Ionotophoresis 4mg /ml Dexamethasone, and Manual therapy   Alphonzo Severance PT, DPT 05/21/2023, 9:57 AM

## 2023-05-28 ENCOUNTER — Other Ambulatory Visit: Payer: Self-pay | Admitting: Obstetrics and Gynecology

## 2023-10-02 ENCOUNTER — Other Ambulatory Visit: Payer: Self-pay | Admitting: Obstetrics and Gynecology

## 2023-12-10 ENCOUNTER — Ambulatory Visit: Admitting: Podiatry

## 2023-12-17 ENCOUNTER — Ambulatory Visit (INDEPENDENT_AMBULATORY_CARE_PROVIDER_SITE_OTHER): Admitting: Podiatry

## 2023-12-17 ENCOUNTER — Encounter: Payer: Self-pay | Admitting: Podiatry

## 2023-12-17 DIAGNOSIS — S90219A Contusion of unspecified great toe with damage to nail, initial encounter: Secondary | ICD-10-CM | POA: Diagnosis not present

## 2023-12-17 NOTE — Progress Notes (Signed)
       Chief Complaint  Patient presents with   Nail Problem    Patient states that she does not want right hallux toe nail removed patient says she is not in any pain and would just like doctor input on right hallux.     HPI: 33 y.o. female presents today with concern for her right first toenail.  Patient denies significant trauma but states that little over a month ago she developed a hematoma underneath the toe likely due to her shoes.  She saw her PCP who put her on antibiotics for period time.  Patient denies any pain today and states that this has been improving over time.  Past Medical History:  Diagnosis Date   Asthma    Depression    Nerve pain    hx shingles   PTSD (post-traumatic stress disorder)    Shingles 2010    Past Surgical History:  Procedure Laterality Date   INCISE AND DRAIN ABCESS      Allergies  Allergen Reactions   Food Anaphylaxis    Bananas, watermelon, grapefruit, cantaloupe, avocado   Cat Dander     07/15/2016 -    ROS    Physical Exam: There were no vitals filed for this visit.  General: The patient is alert and oriented x3 in no acute distress.  Dermatology: Skin is warm, dry and supple bilateral lower extremities. Interspaces are clear of maceration and debris.  Right hallux nail plate well adhered.  There is old subungual hematoma present involving central aspect of nail plate.  Vascular: Palpable pedal pulses bilaterally. Capillary refill within normal limits.  No appreciable edema.  No erythema or calor.  Neurological: Light touch sensation grossly intact bilateral feet.   Musculoskeletal Exam: No pedal deformities noted  Radiographic Exam:  Normal osseous mineralization. Joint spaces preserved.  No fractures or osseous irregularities noted.  Assessment/Plan of Care: 1. Subungual hematoma of great toe      No orders of the defined types were placed in this encounter.  None  Discussed clinical findings with patient  today.  Treatment options discussed with the patient.  She would like to defer removal.  Is not currently bothering her any pain.  It does not appear infected.  Trim the distal aspect of the nail back, the nail was well adhered and did not appear to be lifting off the nail bed.  Patient states she did not wish for me to trim it back any further at this time.  She would like to come back in 4 weeks case she develops any more problems with nail and may want to nail removed at this time.  Recommend Epsom salt soaks in the interim   Dezmon Conover L. Marchia Bond, AACFAS [facility]     [address] [place], [place] [address]                Office [phone]  Fax [phone]

## 2023-12-20 ENCOUNTER — Encounter: Payer: Self-pay | Admitting: Podiatry

## 2024-01-07 ENCOUNTER — Ambulatory Visit: Admitting: Podiatry

## 2024-01-28 ENCOUNTER — Ambulatory Visit: Admitting: Podiatry

## 2024-02-11 ENCOUNTER — Ambulatory Visit (INDEPENDENT_AMBULATORY_CARE_PROVIDER_SITE_OTHER): Admitting: Podiatry

## 2024-02-11 ENCOUNTER — Encounter: Payer: Self-pay | Admitting: Podiatry

## 2024-02-11 DIAGNOSIS — S90219A Contusion of unspecified great toe with damage to nail, initial encounter: Secondary | ICD-10-CM

## 2024-02-11 NOTE — Progress Notes (Signed)
       Chief Complaint  Patient presents with   Nail Problem    RM#1 Nail check on right big toe growing minimal pain feels like pressure under nail by the end of day.    HPI: 33 y.o. female presents today to check on right fourth toe after previously developing hematoma with some nail lifting.  She is doing well.  She denies any pain to the area.  She has not noticed any nail deformity at this point.  Past Medical History:  Diagnosis Date   Asthma    Depression    Nerve pain    hx shingles   PTSD (post-traumatic stress disorder)    Shingles 2010    Past Surgical History:  Procedure Laterality Date   INCISE AND DRAIN ABCESS      Allergies  Allergen Reactions   Food Anaphylaxis    Bananas, watermelon, grapefruit, cantaloupe, avocado   Cat Dander     07/15/2016 -    ROS    Physical Exam: There were no vitals filed for this visit.  General: The patient is alert and oriented x3 in no acute distress.  Dermatology: Skin is warm, dry and supple bilateral lower extremities. Interspaces are clear of maceration and debris.  Right hallux nail plate well adhered.  There is old subungual hematoma present involving central to distal aspect of nail plate.  Vascular: Palpable pedal pulses bilaterally. Capillary refill within normal limits.  No appreciable edema.  No erythema or calor.  Neurological: Light touch sensation grossly intact bilateral feet.   Musculoskeletal Exam: No pedal deformities noted   Assessment/Plan of Care: 1. Subungual hematoma of great toe      No orders of the defined types were placed in this encounter.  None  Discussed clinical findings with patient today.  No significant nail deformity at this point.  She can continue to monitor the area.  We did discuss that if she does notice nail deformity or if nail plate gets loose it would likely be best to remove the toenail.  No need to proceed with this today as she is not having any pain as well.  She  can follow-up as needed if symptoms do develop.   Kuba Shepherd L. Lunda Salines, AACFAS [facility]     [address] [place] [address]                Office [phone]  Fax [phone]    No pain, some stable hematoma growing Subtle nail lifting no other deformity Did discuss removal if there are issues with the nail growing out but she does not want this today  Right 1st

## 2024-06-16 ENCOUNTER — Other Ambulatory Visit (HOSPITAL_COMMUNITY)
Admission: RE | Admit: 2024-06-16 | Discharge: 2024-06-16 | Disposition: A | Discharge status: Home / Self Care | Source: Ambulatory Visit | Attending: Obstetrics and Gynecology | Admitting: Obstetrics and Gynecology

## 2024-06-16 ENCOUNTER — Encounter: Payer: Self-pay | Admitting: Obstetrics and Gynecology

## 2024-06-16 ENCOUNTER — Ambulatory Visit: Admitting: Obstetrics and Gynecology

## 2024-06-16 ENCOUNTER — Other Ambulatory Visit: Payer: Self-pay

## 2024-06-16 VITALS — BP 124/74 | HR 81 | Wt 204.0 lb

## 2024-06-16 DIAGNOSIS — Z124 Encounter for screening for malignant neoplasm of cervix: Secondary | ICD-10-CM

## 2024-06-16 DIAGNOSIS — Z0000 Encounter for general adult medical examination without abnormal findings: Secondary | ICD-10-CM | POA: Diagnosis not present

## 2024-06-16 DIAGNOSIS — Z1331 Encounter for screening for depression: Secondary | ICD-10-CM

## 2024-06-16 DIAGNOSIS — Z01419 Encounter for gynecological examination (general) (routine) without abnormal findings: Secondary | ICD-10-CM

## 2024-06-16 DIAGNOSIS — Z0001 Encounter for general adult medical examination with abnormal findings: Secondary | ICD-10-CM | POA: Diagnosis not present

## 2024-06-16 DIAGNOSIS — Z9889 Other specified postprocedural states: Secondary | ICD-10-CM | POA: Diagnosis not present

## 2024-06-16 DIAGNOSIS — R87613 High grade squamous intraepithelial lesion on cytologic smear of cervix (HGSIL): Secondary | ICD-10-CM | POA: Diagnosis present

## 2024-06-16 DIAGNOSIS — Z113 Encounter for screening for infections with a predominantly sexual mode of transmission: Secondary | ICD-10-CM | POA: Diagnosis not present

## 2024-06-16 NOTE — Progress Notes (Signed)
 ANNUAL EXAM Patient name: [name] MRN [id number]  Date of birth: May 17, 1991 Chief Complaint:   Gynecologic Exam  History of Present Illness:   [name] is a 33 y.o. G0P0000 being seen today for a routine annual exam.  Current complaints: repeat pap, STI testing  Menstrual concerns? No   Breast or nipple changes? No  Contraception use? No  Sexually active? No   Patient's last menstrual period was 05/29/2024 (within days).   The pregnancy intention screening data noted above was reviewed. Potential methods of contraception were discussed. The patient elected to proceed with No data recorded.   Last pap     Component Value Date/Time   DIAGPAP (A) 01/05/2023 1139    - Atypical squamous cells of undetermined significance (ASC-US )   DIAGPAP (A) 04/28/2022 1241    - High grade squamous intraepithelial lesion (HSIL)   DIAGPAP (A) 12/13/2019 1335    - Atypical squamous cells, cannot exclude high grade squamous   DIAGPAP intraepithelial lesion (ASC-H) (A) 12/13/2019 1335   HPVHIGH Negative 01/05/2023 1139   HPVHIGH Positive (A) 04/28/2022 1241   HPVHIGH Positive (A) 12/13/2019 1335   ADEQPAP  01/05/2023 1139    Satisfactory for evaluation; transformation zone component PRESENT.   ADEQPAP  04/28/2022 1241    Satisfactory for evaluation; transformation zone component PRESENT.   ADEQPAP  12/13/2019 1335    Satisfactory for evaluation; transformation zone component PRESENT.    FINAL MICROSCOPIC DIAGNOSIS:   A. ENDOCERVIX, CURETTAGE:  - Scant benign cervical glandular mucosa  - Negative for dysplasia or malignancy   FINAL MICROSCOPIC DIAGNOSIS:   A. ENDOCERVIX, CURETTAGE:  - High-grade squamous intraepithelial lesion (CIN2-3, high grade  dysplasia)   B. CERVIX, LEEP:  - High-grade squamous intraepithelial lesion (CIN2-3, high grade  dysplasia), involving 12-3 o'clock quadrant  - All other quadrants with low-grade squamous intraepithelial lesion   (CIN1, low grade dysplasia)  - No evidence of invasive carcinoma  - The endocervical margin in the 12-3 o'clock quadrant is positive for  high-grade dysplasia  - Ectocervical margins in all quadrants appears positive for low-grade  dysplasia   Last mammogram: n/a.  Last colonoscopy: n/a.      09/04/2022   10:59 AM 07/27/2022    1:44 PM 04/28/2022   11:48 AM 12/13/2019    1:22 PM 07/20/2018    2:48 PM  Depression screen PHQ 2/9  Decreased Interest 0 0 0 0 0  Down, Depressed, Hopeless 0 0 0 0 0  PHQ - 2 Score 0 0 0 0 0  Altered sleeping 0 0 0 0 0  Tired, decreased energy 0 0 0 0 0  Change in appetite 0 0 0 0 0  Feeling bad or failure about yourself  0 0 0 0 0  Trouble concentrating 0 0 0 0 0  Moving slowly or fidgety/restless 0 0 0 0 0  Suicidal thoughts 0 0 0 0 0  PHQ-9 Score 0 0 0 0 0        09/04/2022   11:00 AM 07/27/2022    1:44 PM 04/28/2022   11:48 AM 12/13/2019    1:22 PM  GAD 7 : Generalized Anxiety Score  Nervous, Anxious, on Edge 1 2 0 0  Control/stop worrying 1 0 0 0  Worry too much - different things 0 0 0 0  Trouble relaxing 1 0 0 0  Restless 0 0 0 0  Easily annoyed or irritable 0 0 0 0  Afraid - awful might  happen 1 0 0 0  Total GAD 7 Score 4 2 0 0     Review of Systems:   Pertinent items are noted in HPI Denies any headaches, blurred vision, fatigue, shortness of breath, chest pain, abdominal pain, abnormal vaginal discharge/itching/odor/irritation, problems with periods, bowel movements, urination, or intercourse unless otherwise stated above. Pertinent History Reviewed:  Reviewed past medical,surgical, social and family history.  Reviewed problem list, medications and allergies. Physical Assessment:   Vitals:   06/16/24 1000  BP: (!) 157/64  Pulse: 81  Weight: 204 lb (92.5 kg)  Body mass index is 30.13 kg/m.        Physical Examination:   General appearance - well appearing, and in no distress  Mental status - alert, oriented to person,  place, and time  Psych:  She has a normal mood and affect  Skin - warm and dry, normal color, no suspicious lesions noted  Chest - effort normal, all lung fields clear to auscultation bilaterally  Heart - normal rate and regular rhythm  Breasts - breasts appear normal, no suspicious masses, no skin or nipple changes or  axillary nodes  Abdomen - soft, nontender, nondistended, no masses or organomegaly  Pelvic -  VULVA: normal appearing vulva with no masses, tenderness or lesions   VAGINA: normal appearing vagina with normal color and discharge, no lesions   CERVIX: normal appearing posterior cervix without discharge or lesions, no CMT  Thin prep pap is done with HR HPV cotesting  UTERUS: uterus is felt to be normal size, shape, consistency and nontender   ADNEXA: No adnexal masses or tenderness noted.  Extremities:  No swelling or varicosities noted  Chaperone present for exam  No results found for this or any previous visit (from the past 24 hours).    Assessment & Plan:  1. Well woman exam with routine gynecological exam (Primary) - Cervical cancer screening: Discussed guidelines. Pap with HPV collected - GC/CT: accepts - Birth Control: abstinence - Breast Health: Encouraged self breast awareness/SBE. Teaching provided.  - F/U 12 months and prn  - RPR+HBsAg+HCVAb+...  2. Screening examination for STI Collected STI testing  - RPR+HBsAg+HCVAb+...  3. HGSIL (high grade squamous intraepithelial lesion) on Pap smear of cervix 4. H/O LEEP 5. Screening for cervical cancer S/p LEEP in 2024 w/ subsequent pap w/ ASCUS negative for HPV & benign ECC Pap collected today, follow up per [facility] guidelines   Orders Placed This Encounter  Procedures   RPR+HBsAg+HCVAb+...    Meds: No orders of the defined types were placed in this encounter.   Follow-up: No follow-ups on file.  Carter Quarry, MD 06/16/2024 10:09 AM

## 2024-06-17 LAB — RPR+HBSAG+HCVAB+...
HIV Screen 4th Generation wRfx: NONREACTIVE
Hep C Virus Ab: NONREACTIVE
Hepatitis B Surface Ag: NEGATIVE
RPR Ser Ql: NONREACTIVE

## 2024-06-19 ENCOUNTER — Ambulatory Visit: Payer: Self-pay | Admitting: Obstetrics and Gynecology

## 2024-06-21 LAB — CYTOLOGY - PAP
Adequacy: ABSENT
Chlamydia: NEGATIVE
Comment: NEGATIVE
Comment: NEGATIVE
Comment: NEGATIVE
Comment: NORMAL
Diagnosis: NEGATIVE
High risk HPV: NEGATIVE
Neisseria Gonorrhea: NEGATIVE
Trichomonas: NEGATIVE

## 2024-06-21 NOTE — Telephone Encounter (Signed)
-----   Message from Carter Quarry sent at 06/19/2024  5:49 PM EDT ----- Negative serum STI testing ----- Message ----- From: Rebecka Memos Lab Results In Sent: 06/17/2024   8:37 AM EDT To: Carter Quarry, MD

## 2024-06-21 NOTE — Telephone Encounter (Signed)
 I called patient and heard message voicemail not set up. Per chart review provider did sent patient a MyChart message that has not been read yet.  [name]
